# Patient Record
Sex: Male | Born: 1945 | Race: Black or African American | Hispanic: No
Health system: Southern US, Community
[De-identification: ages and names within clinical notes are randomized; demographics above are authoritative.]

---

## 2007-01-23 ENCOUNTER — Emergency Department: Payer: Self-pay | Admitting: Emergency Medicine

## 2007-01-23 ENCOUNTER — Other Ambulatory Visit: Payer: Self-pay

## 2011-01-12 ENCOUNTER — Ambulatory Visit: Payer: Self-pay | Admitting: Internal Medicine

## 2011-02-21 ENCOUNTER — Inpatient Hospital Stay: Payer: Self-pay | Admitting: Internal Medicine

## 2011-02-21 LAB — CBC
HCT: 35.5 % — ABNORMAL LOW (ref 40.0–52.0)
HGB: 11.5 g/dL — ABNORMAL LOW (ref 13.0–18.0)
MCHC: 32.3 g/dL (ref 32.0–36.0)
RBC: 4.19 10*6/uL — ABNORMAL LOW (ref 4.40–5.90)

## 2011-02-21 LAB — COMPREHENSIVE METABOLIC PANEL
Albumin: 3.4 g/dL (ref 3.4–5.0)
Anion Gap: 7 (ref 7–16)
BUN: 9 mg/dL (ref 7–18)
Bilirubin,Total: 0.7 mg/dL (ref 0.2–1.0)
Chloride: 107 mmol/L (ref 98–107)
Creatinine: 1.04 mg/dL (ref 0.60–1.30)
EGFR (African American): >60
EGFR (Non-African Amer.): >60
Glucose: 114 mg/dL — ABNORMAL HIGH (ref 65–99)
Potassium: 3.8 mmol/L (ref 3.5–5.1)
SGPT (ALT): 35 U/L
Sodium: 143 mmol/L (ref 136–145)
Total Protein: 6.8 g/dL (ref 6.4–8.2)

## 2011-02-21 LAB — TROPONIN I: Troponin-I: <0.02 ng/mL

## 2011-02-21 LAB — PRO B NATRIURETIC PEPTIDE: B-Type Natriuretic Peptide: 20435 pg/mL — ABNORMAL HIGH (ref 0–125)

## 2011-02-22 LAB — CBC WITH DIFFERENTIAL/PLATELET
Basophil #: 0 10*3/uL (ref 0.0–0.1)
Eosinophil #: 0.1 10*3/uL (ref 0.0–0.7)
Eosinophil %: 0.9 %
Lymphocyte #: 1.5 10*3/uL (ref 1.0–3.6)
Lymphocyte %: 23.2 %
Monocyte %: 7.4 %
Neutrophil %: 67.9 %
Platelet: 177 10*3/uL (ref 150–440)
RBC: 3.94 10*6/uL — ABNORMAL LOW (ref 4.40–5.90)
RDW: 16.2 % — ABNORMAL HIGH (ref 11.5–14.5)
WBC: 6.4 10*3/uL (ref 3.8–10.6)

## 2011-02-22 LAB — COMPREHENSIVE METABOLIC PANEL
Albumin: 3 g/dL — ABNORMAL LOW (ref 3.4–5.0)
Anion Gap: 10 (ref 7–16)
BUN: 11 mg/dL (ref 7–18)
Bilirubin,Total: 0.6 mg/dL (ref 0.2–1.0)
Chloride: 107 mmol/L (ref 98–107)
Co2: 29 mmol/L (ref 21–32)
Creatinine: 0.92 mg/dL (ref 0.60–1.30)
EGFR (Non-African Amer.): >60
Osmolality: 290 (ref 275–301)
Potassium: 3.6 mmol/L (ref 3.5–5.1)
Sodium: 146 mmol/L — ABNORMAL HIGH (ref 136–145)

## 2011-02-22 LAB — TROPONIN I: Troponin-I: 0.03 ng/mL

## 2011-02-22 LAB — PROTIME-INR
INR: 1.3
INR: 1.3
Prothrombin Time: 16.8 secs — ABNORMAL HIGH (ref 11.5–14.7)
Prothrombin Time: 16.2 secs — ABNORMAL HIGH (ref 11.5–14.7)

## 2011-02-22 LAB — HEMOGLOBIN A1C: Hemoglobin A1C: 7.7 % — ABNORMAL HIGH (ref 4.2–6.3)

## 2011-02-24 LAB — BASIC METABOLIC PANEL
Anion Gap: 8 (ref 7–16)
Calcium, Total: 8.5 mg/dL (ref 8.5–10.1)
Co2: 33 mmol/L — ABNORMAL HIGH (ref 21–32)
EGFR (African American): >60
EGFR (Non-African Amer.): >60
Glucose: 108 mg/dL — ABNORMAL HIGH (ref 65–99)
Osmolality: 288 (ref 275–301)
Potassium: 4.1 mmol/L (ref 3.5–5.1)
Sodium: 144 mmol/L (ref 136–145)

## 2011-02-24 LAB — CBC WITH DIFFERENTIAL/PLATELET
Basophil #: 0 10*3/uL (ref 0.0–0.1)
Basophil %: 0.4 %
Eosinophil #: 0.1 10*3/uL (ref 0.0–0.7)
Lymphocyte #: 1.3 10*3/uL (ref 1.0–3.6)
MCHC: 31.6 g/dL — ABNORMAL LOW (ref 32.0–36.0)
MCV: 84 fL (ref 80–100)
Neutrophil #: 3.7 10*3/uL (ref 1.4–6.5)
RBC: 4.07 10*6/uL — ABNORMAL LOW (ref 4.40–5.90)
RDW: 15.9 % — ABNORMAL HIGH (ref 11.5–14.5)
WBC: 5.6 10*3/uL (ref 3.8–10.6)

## 2011-02-25 LAB — BASIC METABOLIC PANEL
Anion Gap: 8 (ref 7–16)
Calcium, Total: 8.6 mg/dL (ref 8.5–10.1)
Chloride: 102 mmol/L (ref 98–107)
Co2: 31 mmol/L (ref 21–32)
Creatinine: 0.86 mg/dL (ref 0.60–1.30)
Osmolality: 285 (ref 275–301)

## 2011-02-26 LAB — BASIC METABOLIC PANEL
Anion Gap: 9 (ref 7–16)
BUN: 19 mg/dL — ABNORMAL HIGH (ref 7–18)
Co2: 33 mmol/L — ABNORMAL HIGH (ref 21–32)
Creatinine: 0.92 mg/dL (ref 0.60–1.30)
EGFR (Non-African Amer.): >60
Glucose: 116 mg/dL — ABNORMAL HIGH (ref 65–99)
Osmolality: 283 (ref 275–301)
Potassium: 4.3 mmol/L (ref 3.5–5.1)
Sodium: 140 mmol/L (ref 136–145)

## 2011-02-26 LAB — CBC WITH DIFFERENTIAL/PLATELET
Basophil #: 0 10*3/uL (ref 0.0–0.1)
Lymphocyte #: 1.4 10*3/uL (ref 1.0–3.6)
MCH: 27.9 pg (ref 26.0–34.0)
MCHC: 33.1 g/dL (ref 32.0–36.0)
Monocyte #: 0.4 10*3/uL (ref 0.0–0.7)
Monocyte %: 7.2 %
Neutrophil %: 66.5 %
Platelet: 179 10*3/uL (ref 150–440)
RDW: 14.9 % — ABNORMAL HIGH (ref 11.5–14.5)

## 2011-02-27 LAB — BASIC METABOLIC PANEL
Anion Gap: 10 (ref 7–16)
BUN: 18 mg/dL (ref 7–18)
Calcium, Total: 8.5 mg/dL (ref 8.5–10.1)
Chloride: 98 mmol/L (ref 98–107)
Co2: 33 mmol/L — ABNORMAL HIGH (ref 21–32)
EGFR (African American): >60
Glucose: 106 mg/dL — ABNORMAL HIGH (ref 65–99)
Osmolality: 284 (ref 275–301)

## 2011-04-11 ENCOUNTER — Encounter: Payer: Self-pay | Admitting: Nurse Practitioner

## 2011-04-11 ENCOUNTER — Encounter: Payer: Self-pay | Admitting: Cardiothoracic Surgery

## 2011-04-12 ENCOUNTER — Encounter: Payer: Self-pay | Admitting: Cardiothoracic Surgery

## 2011-04-12 ENCOUNTER — Encounter: Payer: Self-pay | Admitting: Nurse Practitioner

## 2011-05-13 ENCOUNTER — Encounter: Payer: Self-pay | Admitting: Nurse Practitioner

## 2011-05-13 ENCOUNTER — Encounter: Payer: Self-pay | Admitting: Cardiothoracic Surgery

## 2011-06-12 ENCOUNTER — Encounter: Payer: Self-pay | Admitting: Nurse Practitioner

## 2011-06-12 ENCOUNTER — Encounter: Payer: Self-pay | Admitting: Cardiothoracic Surgery

## 2012-03-14 ENCOUNTER — Ambulatory Visit: Payer: Self-pay | Admitting: Internal Medicine

## 2012-04-04 LAB — URINALYSIS, COMPLETE
Bilirubin,UR: NEGATIVE
Glucose,UR: NEGATIVE mg/dL (ref 0–75)
Nitrite: NEGATIVE
Ph: 5 (ref 4.5–8.0)
Protein: 30
RBC,UR: 3 /HPF (ref 0–5)
Squamous Epithelial: NONE SEEN
WBC UR: <1 /HPF (ref 0–5)

## 2012-04-04 LAB — COMPREHENSIVE METABOLIC PANEL
Alkaline Phosphatase: 100 U/L (ref 50–136)
Bilirubin,Total: 2.4 mg/dL — ABNORMAL HIGH (ref 0.2–1.0)
Calcium, Total: 9.3 mg/dL (ref 8.5–10.1)
Creatinine: 1.97 mg/dL — ABNORMAL HIGH (ref 0.60–1.30)
EGFR (African American): 40 — ABNORMAL LOW
EGFR (Non-African Amer.): 35 — ABNORMAL LOW
Glucose: 125 mg/dL — ABNORMAL HIGH (ref 65–99)
Osmolality: 298 (ref 275–301)
SGOT(AST): 37 U/L (ref 15–37)
Sodium: 138 mmol/L (ref 136–145)

## 2012-04-04 LAB — TROPONIN I: Troponin-I: 0.02 ng/mL

## 2012-04-04 LAB — CBC
HCT: 51.6 % (ref 40.0–52.0)
MCH: 28.7 pg (ref 26.0–34.0)
MCV: 89 fL (ref 80–100)
RBC: 5.78 10*6/uL (ref 4.40–5.90)
RDW: 16.3 % — ABNORMAL HIGH (ref 11.5–14.5)

## 2012-04-04 LAB — TSH: Thyroid Stimulating Horm: 6.9 u[IU]/mL — ABNORMAL HIGH

## 2012-04-04 LAB — PRO B NATRIURETIC PEPTIDE: B-Type Natriuretic Peptide: 32878 pg/mL — ABNORMAL HIGH (ref 0–125)

## 2012-04-05 ENCOUNTER — Inpatient Hospital Stay: Payer: Self-pay | Admitting: Internal Medicine

## 2012-04-06 LAB — BASIC METABOLIC PANEL
Anion Gap: 11 (ref 7–16)
BUN: 73 mg/dL — ABNORMAL HIGH (ref 7–18)
Chloride: 98 mmol/L (ref 98–107)
Co2: 28 mmol/L (ref 21–32)
EGFR (African American): 40 — ABNORMAL LOW
Glucose: 63 mg/dL — ABNORMAL LOW (ref 65–99)
Osmolality: 293 (ref 275–301)
Potassium: 3.8 mmol/L (ref 3.5–5.1)

## 2012-04-06 LAB — HEMOGLOBIN A1C: Hemoglobin A1C: 7.5 % — ABNORMAL HIGH (ref 4.2–6.3)

## 2012-04-08 LAB — BASIC METABOLIC PANEL
Anion Gap: 13 (ref 7–16)
Creatinine: 3.52 mg/dL — ABNORMAL HIGH (ref 0.60–1.30)
EGFR (African American): 20 — ABNORMAL LOW
Glucose: 79 mg/dL (ref 65–99)

## 2012-04-09 LAB — BASIC METABOLIC PANEL
Anion Gap: 15 (ref 7–16)
BUN: 95 mg/dL — ABNORMAL HIGH (ref 7–18)
Calcium, Total: 8.8 mg/dL (ref 8.5–10.1)
Co2: 23 mmol/L (ref 21–32)
Creatinine: 3.74 mg/dL — ABNORMAL HIGH (ref 0.60–1.30)
EGFR (African American): 19 — ABNORMAL LOW
Osmolality: 285 (ref 275–301)
Potassium: 5.2 mmol/L — ABNORMAL HIGH (ref 3.5–5.1)
Sodium: 128 mmol/L — ABNORMAL LOW (ref 136–145)

## 2012-04-11 ENCOUNTER — Ambulatory Visit: Payer: Self-pay | Admitting: Internal Medicine

## 2012-05-12 DEATH — deceased

## 2014-06-03 NOTE — Consult Note (Signed)
   Comments   I met with pt, wife, daughter, and sister. Pt more awake, oriented x 3 and holding a conversation. I had long discussion concerning medical goals, and d/c planning. PT evaluation remains pending at this time. Questions answered. Discussed with pt about option of STR. He declined STR/SNF, he wants to go home. He was open to home with Hospice services which he would qualify for. He does meet criteria with dx cardiomyopathy. I discussed HCPOA which at this time is his wife even though he does not live with her. I discussed code status and pt verbalizes that he wants to be a DNR. Out of facility form completed and order entered. Emotionalsupport provided.  dx: cardiomyopathy: secondary FTT (bmi 20); CKD  Electronic Signatures for Addendum Section:  Phifer, Izora Gala (MD) (Signed Addendum 25-Feb-14 07:12)  Discussed with Christin Gusler, NP, in detail. Agree with assessment and plan as outlined in above note.   Electronic Signatures: Kateri Mc, Christin Z (NP)  (Signed 24-Feb-14 13:49)  Authored: Palliative Care   Last Updated: 25-Feb-14 07:12 by Phifer, Izora Gala (MD)

## 2014-06-03 NOTE — H&P (Signed)
PATIENT NAME:  Bruce West, Bruce West MR#:  [id number] DATE OF BIRTH:  [birth year]  DATE OF ADMISSION:  04/05/2012  PRIMARY CARE PHYSICIAN:  Dr. Earlene Plater at [facility]   PRIMARY CARDIOLOGIST:  Dr. Rudi Coco   CHIEF COMPLAINT:  Shortness of breath.  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  The patient states at baseline has shortness of breath. Said today, as it is warm, came out, cleaned his yard and cleaned his cars.  After that, started to experience more worsening of his shortness of his breath, and concerning this called EMS.  When the EMS arrived the patient was found to have low oxygen saturations.  Concerning this, patient was brought to the Emergency Department.  Denies having any cough.  Denies having any chest pain, palpitations.  Denies having any worsening of the lower extremity swelling from the baseline.  However, patient states has some weight gain in the last few days.  Has been experiencing severe generalized weakness.  Mostly stays in the bed.  Extremely nauseated with decreased appetite.  The patient states about a year back the patient was weighing 225 pounds; currently weighs 170 pounds.  The patient also states that he has early satiety.  No appetite.  Denies having any diarrhea.  Denies having dysuria.    PAST MEDICAL HISTORY:   1.  Hypertension.  2.  Hyperlipidemia. 3.  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  Hemoglobin A1c one year back was 7.7. 4.  Hypothyroidism. 5.  Dilated cardiomyopathy with EF of 20%.  Heart cath done in 2013 showed nonobstructive coronary artery disease, about 20% to 30% blockage.  6.  Systolic congestive heart failure, chronic.  Workup in the Emergency Department reveals patient has  elevated BUN and creatinine from the baseline.  The patient is also found to have elevated BNP of 32,000.  The patient states he has been compliant with fluid intake.  Usually eats canned food, canned beans.    PAST SURGICAL HISTORY:  None.  ALLERGIES:  No known drug allergies.  HOME MEDICATIONS:   1.  Lantus 42 units twice daily.  2.  Metformin 500 mg twice daily. 3.  Enalapril 10 mg daily. 4.  Pravastatin 40 mg daily. 5.  Neurontin 300 mg 3 times a day. 6.  Lasix 40 mg 2 times a day. 7.  Coreg 3.125 mg 2 times a day. 8.  Citalopram 20 mg daily. 9.  Aspirin 81 mg daily.  SOCIAL HISTORY:  No history of smoking, drinking alcohol or using illicit drugs.  Lives by himself.  Occasionally his grandchildren stay with him.  The patient states independent of ADLs and IADLs.    FAMILY HISTORY:  Diabetes mellitus in the family.    REVIEW OF SYSTEMS:  CONSTITUTIONAL:  Has severe generalized weakness, fatigue, weight loss. EYES:  No change in the vision.  EARS, NOSE, THROAT:  Denies having any tinnitus, hearing loss, difficulty swallowing.  RESPIRATORY:  Has shortness of breath, chronic.  Denies having any PND, orthopnea.  CARDIOVASCULAR:  Denies having any orthopnea, PND.  Has dyspnea on exertion.  GASTROINTESTINAL:  Has chronic nausea, anorexia.  GENITOURINARY:  Denies any dysuria, hematuria. ENDOCRINOLOGY:  Denies any increased polyuria or polydipsia.  HEMATOLOGY:  Denies having any easy bruising, bleeding.  SKIN:  Has chronic ulcers in the lower extremities.   MUSCULOSKELETAL:  Denies having any joint pains in legs.  NEUROLOGIC:  Has numbness and the burning sensation in the lower extremities.   PHYSICAL EXAMINATION: GENERAL:  This is a cachectic-looking male laying down in the bed not in distress. VITAL SIGNS:  Temperature 98.3, pulse 107, blood pressure 104/73, respiratory rate of 16, oxygen saturation is 100% on 2 liters of oxygen. HEENT:  Head normocephalic, atraumatic.  Eyes, no  scleral icterus.  Conjunctivae normal.  Pupils equal and reactive to light.  Bitemporal wasting.   NECK:  Supple.  No lymphadenopathy.  No JVD.  No carotid bruit. CHEST:  Has no focal tenderness.  Bibasilar crackles are heard occasional. HEART:  S1, S2, regular, no murmurs are heard. ABDOMEN:  Was slightly distended. Has fluid shift plus, nontender, nondistended.  EXTREMITIES:  Chronic changes consistent with poor blood flow with some ulcerations on the shins of both legs.   NEUROLOGIC:  The patient is alert, oriented to place, person and time.  Cranial nerves II-XII intact.  No motor and sensory deficits.   LABORATORY DATA:  UA negative for nitrites and leukocyte esterase.  Cardiac enzymes are negative.  TSH is 6.9.  BNP 33,000.  CMP: Sodium 138, BUN 72, creatinine of 1.97.  CBC: WBC of 8.2, hemoglobin 16.6, platelet count of 127.    Chest x-ray, atelectasis and the pleural fluid at the right lung base.  The left lung base clear.  There is mild enlargement of the cardiac silhouette without pulmonary vascular congestion.    ASSESSMENT AND PLAN:  The patient is a 69 year old with dilated cardiomyopathy, diabetes mellitus, comes with shortness of breath.   1.  Shortness of breath secondary to congestive heart failure, acute on chronic.  We will continue with the diuresis.  Continue the Coreg.   2.  Acute renal insufficiency.  The patient had creatinine of 1 in January 2013.  Today it is 1.9.  We will hold the ACE inhibitor.  This could be caused by the congestive heart failure.  We will continue the diuresis and follow closely with BUN and creatinine.  Urine sodium will not give the answer for the cause of the acute renal failure as well as the FeNa as patient is on diuretics.   3.  Diabetes mellitus.  We will hold the metformin.  Continue the Lantus and sliding scale insulin.   4.  Dilated cardiomyopathy.  Left heart cath done in 2013 showed nonobstructive disease 20% to 30%.   5.  Hypertension, on  Coreg.  6.  Failure to thrive.  The patient states has severe anorexia. Has chronic nausea, early satiety.  We will also check the lactic acid considering patient being on metformin and acute renal insufficiency.  The patient states no night sweats, cough.  The patient denies having any risk factors for HIV.  The patient states did not have any colonoscopy in the last 10 years.  Concerning about patient's early satiety we will obtain CT abdomen and pelvis without contrast as the patient has renal insufficiency.  We will check the stool occult.   7.  Protein calorie malnutrition, severe.  We will check the prealbumin.  We will obtain nutritionist consult.   8.  Hypothyroidism.  The patient has a TSH of 6.9.  As per patient's home medications, currently not on any Synthroid.  We will start on 25 mcg.   9.  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  Concern about patient  has severe peripheral vascular disease.  The patient will need workup as an outpatient for peripheral vascular disease obtaining the arterial Dopplers.   10.  Keep the patient on deep vein thrombosis prophylaxis with Lovenox.      ____________________________ Susa GriffinsPadmaja Lucah Petta, MD pv:ea D: 04/05/2012 01:37:42 ET T: 04/05/2012 03:48:20 ET JOB#: [id number]  cc: Susa GriffinsPadmaja Marian Grandt, MD, <Dictator> Susa GriffinsPADMAJA Lenny Fiumara MD ELECTRONICALLY SIGNED 04/06/2012 7:20

## 2014-06-03 NOTE — Discharge Summary (Signed)
PATIENT NAME:  Bruce West, Bruce West MR#:  [id number] DATE OF BIRTH:  03/27/1945  DATE OF ADMISSION:  04/05/2012 DATE OF DISCHARGE:    PRIMARY CARE PHYSIC[facility]  [facility]   PRIMARY CARDIOLOGIST:  Lamar BlinksBruce J. Kowalski, MD  FINAL DIAGNOSES:   1.  Acute on chronic systolic congestive heart failure with cardiomyopathy, ejection fraction less than 20% with anasarca, pleural effusion and hypotension.  2.  Acute renal failure and urinary retention.  3.  Hypoglycemia.  4.  Severe malnutrition.  5.  Weight loss.  6.  Weakness.  7.  Nonhealing ulcers.   MEDICATIONS ON DISCHARGE TO THE HOSPICE HOME:  Include Roxanol 20 mg/mL, 0.5 mL every 1 to 2 hours as needed for pain or shortness of breath.   CODE STATUS:  The patient is a DO NOT RESUSCITATE.   DIET:  As tolerated.   ACTIVITY:  As tolerated.   OXYGEN:  2 liters.   May leave Foley catheter in for urinary retention.   HOSPITAL COURSE:  The patient was admitted on April 05, 2012 and discharged on April 09, 2012. The patient came in with shortness of breath.   HISTORY OF PRESENT ILLNESS:  A 69 year old man with diabetes, cardiomyopathy and CHF. He is coming in with shortness of breath. He does have weight loss. He was admitted to the hospital for acute on chronic systolic congestive heart failure and diuresed with IV Lasix. He had acute renal failure and diabetes. His metformin was held and he was kept on his Lantus initially. For his failure to thrive, a CT scan of the abdomen and pelvis was ordered. He does have severe malnutrition, hypothyroidism and chronic nonhealing ulcers.   LABORATORY AND RADIOLOGICAL DATA DURING THE HOSPITAL COURSE:  Included an EKG that showed left axis deviation and anterior septal infarct previously. Chest x-ray showed atelectasis, pleural fluid at the right lung base, left lung is clear and enlargement of the cardiac silhouette. White blood cell count 8.2, H and H 16.6 and 51.6, platelet count 127. Urinalysis  showed 1+ blood. TSH was 6.9. BNP was 32,878. Glucose was 125, BUN 72, creatinine 1.97, sodium 138, potassium 3.5, chloride 99, CO2 was 27, calcium 9.3, total bilirubin 2.4, alkaline phosphatase 100, ALT 26, AST 37. Ultrasound of the lower extremities showed no evidence of DVT. Prealbumin was 8. CT of abdomen showed large right pleural effusion, consolidated lung, enlargement of the cardiac chambers, large amount of ascites, liver cysts. Occult blood negative. The patient on April 06, 2012 had a sugar of 2.8, hemoglobin A1c was 7.5. Creatinine on April 08, 2012 was up to 3.52 and creatinine on April 09, 2012 was 3.74.   HOSPITAL COURSE PER PROBLEM LIST:  1.  The patient does have end-stage congestive heart failure with cardiomyopathy, EF less than 20% with anasarca, pleural effusions and hypotension. I was holding his medications secondary to hypotension. Initially, the patient was diuresed with Lasix 40 mg IV b.i.d.   2.  The patient developed acute renal failure and urinary retention requiring Foley catheter and IV fluid bolus likely secondary to over-diuresis.  3.  The patient had severe hypoglycemia. His metformin was held on admission. His Lantus needed to be stopped.  4.  For his severe malnutrition, his overall prognosis is poor.  5.  His weight loss is probably secondary to end-stage CHF.  6.  Weakness. The patient is not getting up and ambulatory and did not work well with PT.  7.  For his nonhealing ulcers, they  are probably from arterial and venous insufficiency.   Overall, prognosis is poor. I did speak with sister and hospice liaison. I did speak with the family and patient and convinced him to go to the Hospice Home. The patient will be discharged to the Hospice Home today with Roxanol for pain relief. The patient does have multiorgan failure with hypotension, acute renal failure, end-stage CHF, hypoglycemia, severe malnutrition and respiratory failure on oxygen. Overall, prognosis  is poor. The patient is a DO NOT RESUSCITATE. I do not expect the patient to live much longer.   TIME SPENT ON DISCHARGE:  35 minutes.    ____________________________ Herschell Dimes. Renae Gloss, MD rjw:si D: 04/09/2012 15:03:09 ET T: 04/09/2012 15:18:22 ET JOB#: [id number]  cc: Herschell Dimes. Renae Gloss, MD, <Dictator> Lamar Blinks, MD Dr. Earlene Plater at [facility] MD ELECTRONICALLY SIGNED 04/14/2012 13:15

## 2014-06-03 NOTE — Consult Note (Signed)
PATIENT NAME:  Bruce West, Bruce West MR#:  [id number] DATE OF BIRTH:  03/21/1945  DATE OF CONSULTATION:  04/08/2012  REFERRING PHYSICIAN:  Dr. Jacques Navy. CONSULTING PHYSICIAN:  Wess Baney C. Ikaika Showers, MD  REASON FOR CONSULTATION:  Acute urinary retention.   HISTORY OF PRESENT ILLNESS:  The patient is a 69 year old male admitted 04/05/2012 with shortness of breath secondary to congestive heart failure.  He was also noted to have acute renal insufficiency with creatinine of 1.9.  He has a dilated cardiomyopathy.  He denies any previous history of voiding problems or urologic problems.  He is noted to have decreased urine output.  In and out catheterization was performed this morning for a bladder scan and approximately 220 mL with only 150 mL of urine obtained.  This evening his bladder scan was 700 mL.  He had multiple attempts at a standard 16 [place] Foley, 14 and 16 French coude catheters and a regular 14 French catheter without success.  Urology consultation was requested.  He presently has no urge to void.  No previous history of urologic problems or prostate surgery.   PAST MEDICAL HISTORY: 1.  Hypertension.  2.  Hyperlipidemia.  3.  Long-standing diabetes mellitus.  4.  Hypothyroidism.  5.  Dilated cardiomyopathy.  6.  Congestive heart failure.   PAST SURGICAL HISTORY:  None.   MEDICATIONS ON ADMISSION:  Lantus insulin 42 units twice daily, metformin 500 mg twice daily, enalapril 10 mg daily, pravastatin 40 mg daily, Neurontin 300 mg 3 times a day, Lasix 40 mg twice a day, Coreg 3.125 mg twice daily, citalopram 20 mg daily, ASA 81 mg daily.   SOCIAL HISTORY:  No tobacco or alcohol use.   REVIEW OF SYSTEMS:  CONSTITUTIONAL:  Positive weakness, fatigue, weight loss.  EYES:  No visual changes.  EARS, NOSE, THROAT:  No hearing loss, nasal congestion.  PULMONARY:  Positive shortness of breath.  CARDIOVASCULAR:  No chest pain or palpitations.  Positive dyspnea on exertion.  GASTROINTESTINAL:  Chronic  nausea.  GENITOURINARY:  As per the history of present illness.  ENDOCRINE:  Long-standing diabetes mellitus.  HEMATOLOGY:  No history of bleeding or clotting disorders.  SKIN:  Chronic lower extremity ulcers.  MUSCULOSKELETAL:  No joint pain.  NEUROLOGIC:  Lower extremity paresthesias.   PHYSICAL EXAMINATION:  VITAL SIGNS:  Temperature 97.6, blood pressure 119/91, pulse 92.  GENERAL:  Thin male in no acute distress.   ABDOMEN:  Protuberant.  The bladder is not palpable or percussible.  GENITOURINARY:  Phallus uncircumcised without lesions.  Testes descended bilaterally.  Prostate examination was deferred.   LABORATORY DATA:  CT was reviewed, bladder was no significant distention.  No significant prostate enlargement noted.   PROCEDURE:  External genitalia were prepped and draped in the usual fashion.  A 12 French silicon catheter was passed and did feel it was curling within the prostate.  No significant resistance was noted.  This was removed and an 53 [place] coude catheter was placed without problems.  Approximately 150 mL of slightly bloody urine was obtained.  Catheter irrigated without problems confirming correct position.   IMPRESSION:  Status post Foley catheter placement.  He does not appear to have significant urinary retention.  His bladder scan may be false-positive secondary to significant ascites.   RECOMMENDATION:  Would keep catheter in place until no longer needed for output monitoring.        ____________________________ Verna Czech. Lonna Cobb, MD scs:ea D: 04/08/2012 23:57:00 ET T: 04/09/2012 01:53:11 ET JOB#: [id number]  cc:  Jeni Duling C. Lonna CobbStoioff, MD, <Dictator> Riki AltesSCOTT C Susann Lawhorne MD ELECTRONICALLY SIGNED 04/23/2012 22:51

## 2014-06-03 NOTE — Consult Note (Signed)
Brief Consult Note: Patient was seen by consultant.   Consult note dictated.   Comments: 18 Fr coude cath placed w/o problems.  Electronic Signatures: Riki AltesStoioff, Dmiyah Liscano C (MD)  (Signed [phone]-Feb-14 23:58)  Authored: Brief Consult Note   Last Updated: 26-Feb-14 23:58 by Riki AltesStoioff, Devlin Mcveigh C (MD)

## 2014-06-03 NOTE — Consult Note (Signed)
CHIEF COMPLAINT and HISTORY:  Subjective/Chief Complaint BLE ulcerations   History of Present Illness Patient admited with CHF, failure to thrive.   Had been found to have ulcers on both LE.  Unclear how long they have been there.  He is very somnulent today and does not provide any history to me.  This is obtained from the medical record.   Past History CHF, renal insufficiency   Past Medical Health Coronary Artery Disease, Hypertension, Diabetes Mellitus   PAST MEDICAL/SURGICAL HISTORY:  Past Medical History:   Diabetes:    Hypercholesterolemia:    htn:   ALLERGIES:  Allergies:  No Known Allergies:   HOME MEDICATIONS:  Home Medications: Medication Instructions Status  furosemide 40 mg oral tablet 1 tab(s) orally 2 times a day Active  Silvadene 1% topical cream application topically once a day Active  BD ultrafine short pen needles    once a day Active  Lantus Solostar Pen 42 unit(s) subcutaneous 2 times a day Active  Neurontin 300 mg oral capsule 1 cap(s) orally 3 times a day Active  lancets  1   3 times a day Active  aspirin 81 mg oral tablet 1 tab(s) orally once a day Active  Lasix 40 mg oral tablet 1 tab(s) orally 2 times a day Active  metformin 500 mg oral tablet 1 tab(s) orally 2 times a day Active  potassium chloride 20 mEq oral tablet, extended release 1 tab(s) orally once a day Active  Coreg 3.125 mg oral tablet 1 tab(s) orally 2 times a day Active   Family and Social History:  Family History Non-Contributory   Social History negative tobacco, negative ETOH, negative Illicit drugs, from the H&P, patient does not provide to me   Place of Living Home   Review of Systems:  ROS Pt not able to provide ROS   Medications/Allergies Reviewed Medications/Allergies reviewed   Physical Exam:  GEN chronically ill appearing   HEENT moist oral mucosa, poor dentition   NECK No masses  trachea midline   RESP normal resp effort  no use of accessory muscles    CARD irregular rate  murmur present   ABD denies tenderness  distended  appears to have ascites   LYMPH negative neck, negative axillae   EXTR positive edema   SKIN positive ulcers, BLE ulcers with stasis changes present.  Pulses not really palpable, but swelling and woody tissue present.   NEURO difficult to assess with somnulence   PSYCH poor insight, lethargic   LABS:  Laboratory Results: Thyroid:    22-Feb-14 22:25, Thyroid Stimulating Hormone  Thyroid Stimulating Hormone 6.90  0.45-4.50  (International Unit)   -----------------------  Pregnant patients have   different reference   ranges for TSH:   - - - - - - - - - -   Pregnant, first trimetser:   0.36 - 2.50 uIU/mL  Hepatic:    22-Feb-14 22:25, Comprehensive Metabolic Panel  Bilirubin, Total 2.4  Alkaline Phosphatase 100  SGPT (ALT) 26  SGOT (AST) 37  Total Protein, Serum 8.2  Albumin, Serum 3.6  General Ref:    23-Feb-14 03:29, Prealbumin  Prealbumin   ========== TEST NAME ==========  ========= RESULTS =========  = REFERENCE RANGE =    PREALBUMIN    Prealbumin  Prealbumin                      [L  8 mg/dL              ]  [place]            No: [id number]            [address] [place], [place] [address]            Lindon Romp, MD         [phone]     Result(s) reported on 06 Apr 2012 at 02:18AM.  Routine Chem:    22-Feb-14 09:32, Basic Metabolic Panel (w/Total Calcium)  Glucose, Serum -  BUN -  Creatinine (comp) -  Sodium, Serum -  Potassium, Serum -  Chloride, Serum -  CO2, Serum -  Calcium (Total), Serum -  Anion Gap -  Osmolality (calc) -  eGFR (African American) -  eGFR (Non-African American) -  eGFR values <69m/min/1.73 m2 may be an indication of chronic  kidney disease (CKD).  Calculated eGFR is useful in patients with stable renal function.  The eGFR calculation will not be reliable in acutely ill patients  when serum  creatinine is changing rapidly. It is not useful in   patients on dialysis. The eGFR calculation may not be applicable  to patients at the low and high extremes of body sizes, pregnant  women, and vegetarians.    22-Feb-14 21:10, Troponin I  Result Comment   METB/CARDIAC/TROP - CANCEL/SPECIMEN HEMOLYZED/NEEDS REDRAW   Result(s) reported on 04 Apr 2012 at 09:49PM.    22-Feb-14 22:25, B-Type Natriuretic Peptide ([facility]  B-Type Natriuretic Peptide ([facility] [phone] Result(s) reported on 04 Apr 2012 at 11:14PM.    22-Feb-14 22:25, Cardiac Panel  Result Comment   POTASSIUM/CPK/AST - Slight hemolysis, interpret results with   - caution.   Result(s) reported on 04 Apr 2012 at 11:02PM.    22-Feb-14 22:25, Comprehensive Metabolic Panel  Glucose, Serum 125  BUN 72  Creatinine (comp) 1.97  Sodium, Serum 138  Potassium, Serum 3.5  Chloride, Serum 99  CO2, Serum 27  Calcium (Total), Serum 9.3  Osmolality (calc) 298  eGFR (African American) 40  eGFR (Non-African American) 35  eGFR values <635mmin/1.73 m2 may be an indication of chronic  kidney disease (CKD).  Calculated eGFR is useful in patients with stable renal function.  The eGFR calculation will not be reliable in acutely ill patients  when serum creatinine is changing rapidly. It is not useful in   patients on dialysis. The eGFR calculation may not be applicable  to patients at the low and high extremes of body sizes, pregnant  women, and vegetarians.  Anion Gap 12    24-Feb-14 0458:09Basic Metabolic Panel (w/Total Calcium)  Glucose, Serum 63  BUN 73  Creatinine (comp) 1.99  Sodium, Serum 137  Potassium, Serum 3.8  Chloride, Serum 98  CO2, Serum 28  Calcium (Total), Serum 8.9  Anion Gap 11  Osmolality (calc) 293  eGFR (African American) 40  eGFR (Non-African American) 34  eGFR values <6054min/1.73 m2 may be an indication of chronic  kidney disease (CKD).  Calculated eGFR is useful in patients with stable renal function.  The  eGFR calculation will not be reliable in acutely ill patients  when serum creatinine is changing rapidly. It is not useful in   patients on dialysis. The eGFR calculation may not be applicable  to patients at the low and high extremes of body sizes, pregnant  women, and vegetarians.    24-Feb-14 04:37, Hemoglobin A1c ([facility])  Hemoglobin  A1c [facility]) 7.5  The American Diabetes Association recommends that a primary goal of  therapy should be <7% and that physicians should reevaluate the  treatment regimen in patients with HbA1c values consistently >8%.  Cardiac:    22-Feb-14 21:10, Cardiac Panel  CK, Total -  CPK-MB, Serum -  Result(s) reported on 04 Apr 2012 at 09:49PM.    22-Feb-14 21:10, Troponin I  Troponin I -  0.00-0.05  0.05 ng/mL or less: NEGATIVE   Repeat testing in 3-6 hrs   if clinically indicated.  >0.05 ng/mL: POTENTIAL   MYOCARDIAL INJURY. Repeat   testing in 3-6 hrs if   clinically indicated.  NOTE: An increase or decrease   of 30% or more on serial   testing suggests a   clinically important change    22-Feb-14 22:25, Cardiac Panel  CK, Total 77  CPK-MB, Serum 3.1    22-Feb-14 22:25, Troponin I  Troponin I 0.02  0.00-0.05  0.05 ng/mL or less: NEGATIVE   Repeat testing in 3-6 hrs   if clinically indicated.  >0.05 ng/mL: POTENTIAL   MYOCARDIAL INJURY. Repeat   testing in 3-6 hrs if   clinically indicated.  NOTE: An increase or decrease   of 30% or more on serial   testing suggests a   clinically important change  Routine UA:    22-Feb-14 22:25, Urinalysis  Color (UA) Yellow  Clarity (UA) Clear  Glucose (UA) Negative  Bilirubin (UA) Negative  Ketones (UA) Negative  Specific Gravity (UA) 1.011  Blood (UA) 1+  pH (UA) 5.0  Protein (UA) 30 mg/dL  Nitrite (UA) Negative  Leukocyte Esterase (UA) Negative  Result(s) reported on 04 Apr 2012 at 11:18PM.  RBC (UA) 3 /HPF  WBC (UA) <1 /HPF  Bacteria (UA) TRACE  Epithelial Cells (UA)   NONE SEEN  Mucous  (UA) PRESENT  Hyaline Cast (UA) 14 /LPF  Result(s) reported on 04 Apr 2012 at 11:18PM.  Routine Sero:    23-Feb-14 11:20, Occult Blood, Feces  Occult Blood, Feces NEGATIVE  Result(s) reported on 05 Apr 2012 at 12:14PM.  Routine Hem:    22-Feb-14 21:10, Hemogram, Platelet Count  WBC (CBC) 8.2  RBC (CBC) 5.78  Hemoglobin (CBC) 16.6  Hematocrit (CBC) 51.6  Platelet Count (CBC) 127  Result(s) reported on 04 Apr 2012 at 09:32PM.  MCV 89  MCH 28.7  MCHC 32.2  RDW 16.3   RADIOLOGY:  Radiology Results: XRay:    22-Feb-14 19:34, Chest PA and Lateral  Chest PA and Lateral  REASON FOR EXAM:    SOB  COMMENTS:       PROCEDURE: DXR - DXR CHEST PA (OR AP) AND LATERAL  - Apr 04 2012  7:34PM     RESULT: Comparison is made to the study of February 21, 2011.    The lungs are better inflated today. There remain increased lung markings   at the right lung base with a small amount of pleural fluid noted. The   left lung base is clear and the costophrenic angle is sharp. The cardiac   silhouette is mildly enlarged. The pulmonary vascularity is not clearly   engorged. The mediastinum is normal in width. The bony thorax exhibits no   acute abnormality.    IMPRESSION:   1. There is atelectasis and pleural fluid at the right lung base. The   left lung is clear.  2. There is mild enlargement of the cardiac silhouette without pulmonary   vascular congestion.  Dictation Site: 5        Verified By: DAVID A. Martinique, M.D., MD  [place]:    23-Feb-14 00:33, [place] Color Flow Doppler Low Extrem Bilat (Legs)  [place] Color Flow Doppler Low Extrem Bilat (Legs)  REASON FOR EXAM:    decreased activity, leg pain  COMMENTS:       PROCEDURE: [place]  - US DOPPLER LOW EXTR BILATERAL  - Apr 05 2012 12:33AM     RESULT: Grayscale and color flow Doppler techniques were employed to   evaluate the deep veins of the right and left lower extremities.    The common femoral, superficial femoral, and popliteal veins are  normally   compressible. The waveform patterns are normal and the color flow images   are normal. The patient does appear to be tachycardic. The response to   the augmentation and Valsalva maneuvers is normal.    IMPRESSION:  There is no evidence of thrombus within the deep veins of   the right or left lower extremities.  A preliminary report was sent to the emergency department at the   conclusion of thestudy.     Dictation Site: 1        Verified By: DAVID A. Martinique, M.D., MD  LabUnknown:    13-Mar-13 11:39, Wound Aerobic/Anaerobic Culture  Ind. Clindamycin Resistance    22-Feb-14 19:34, Chest PA and Lateral  PACS Image    23-Feb-14 00:33, [place] Color Flow Doppler Low Extrem Bilat (Legs)  PACS Image    23-Feb-14 08:51, CT Chest Abdomen and Pelvis WO  PACS Image  CT:  CT Chest Abdomen and Pelvis WO  REASON FOR EXAM:    (1) weight loss; (2) Early satiety  COMMENTS:   May transport without cardiac monitor    PROCEDURE: CT  - CT CHEST ABDOMEN AND PELVIS WO  - Apr 05 2012  8:51AM     RESULT: Axial noncontrast CT scanning was performed through the chest,   abdomen, and pelvis. The patient's serum creatinine is 1.9 mg/dL which   precluds use of IV contrast. Review of multiplanar reconstructed images   was performed separately on the VIA monitor.    CT scan of the chest: There is a large right pleural effusion. There is   consolidated lung adjacent to the anterior surface of this effusion in   the lower hemithorax. There is no left pleural effusion. The cardiac   chambers are enlarged. The caliber of the thoracic aorta is normal. No   bulky mediastinal or hilar lymph nodes are demonstrated. There is no     axillary lymphadenopathy. There is generalized wasting of subcutaneous   fat.    At lung window settings there is minimal increased interstitial density   in the posterior costophrenic gutter on the left. There is the   aforementioned consolidated lung along the anterior  surface of the right   pleural effusion. There are atelectatic versus fibrotic changes in the   right middle lobe. No suspicious pulmonary parenchymal masses are   demonstrated. The thoracic vertebral bodies are preserved in height. No   lytic nor blastic rib lesion is demonstrated.    Conclusion:  1. There is a large right pleural effusion. There is consolidated lung   along the anterior surface of the pleural effusion in the right lower   hemithorax.  2. There is enlargement of the cardiac chambers without definite evidence   of a pericardial effusion.  3. No pulmonary parenchymal mass is demonstrated.  CT scan of the abdomen: There are four hypodensities within the liver.   The largest lies in the left lobe and measures 2.3 centimeters in   greatest dimension. It exhibits Hounsfield measurement of +2 and is most   compatible with a cyst. There is no intrahepatic ductal dilation. There   is a large amount of ascites within the abdomen. No discrete omental   masses are demonstrated but fine detail of the soft tissue structures   within the abdomen and pelvis is limited due to lack of oral and IV   contrast and to the paucity of intra-abdominal fat.    The stomach is partially distended with fluid and fluid and gas. The   pancreas exhibits fatty infiltration but no focal mass. The gallbladder     is adequately distended. I cannot exclude subcentimeter radiodense stones   or sludge in the dependent portion of the gallbladder. No adrenal masses   are demonstrated. There is no evidence of a small or large bowel   obstruction. Loops of normal calibered gas-filled small and large bowel   are floating in the ascites. The spleen is not enlarged. There is   increased density within the retroperitoneal fat fairly diffusely. The   kidneys exhibit normal contour and no evidence of calcified stones or   obstruction. The caliber of the abdominal aorta is normal. No bulky   periaortic or  pericaval or mesenteric lymph nodes are demonstrated.    Within the inguinal regions no hernia is demonstrated. There are a few   borderline enlarged lymph nodes present in the inguinal regions. The   lumbar vertebral bodies are preserved in height. The bony pelvisexhibits   no lytic or blastic lesions.  IMPRESSION:   1. There is a large right pleural effusion and consolidated right lung   adjacent to this effusion. No discrete pulmonary mass is demonstrated but   certainly one could be obscured by the fluid and consolidated lung.  2. There is a large amount of ascites present. There are hypodensities in   the liver most compatible with cysts. The liver does not appear to be   shrunken and there is no splenomegaly. However, cirrhosis is not excluded.  3. There is no evidence of bowel obstruction or ileus. No suspicious   bowel wall thickening or luminal masses are demonstrated.  4. There is no acute urinary tract abnormality.  5. No pancreatic mass is demonstrated.    The sensitivity of the study is limited without oral and intravenous   contrast material.   Dictation Site: 5        Verified By: DAVID A. Martinique, M.D., MD   ASSESSMENT AND PLAN:  Assessment/Admission Diagnosis BLE ulceration. Likely mixed arterial and venous and poor perfusion from low EF   Plan will likely need bilateral LE angiograms in staged fashion with renoprotective agents if aggressive care is desired.  See palliative care to get involved.  Would also need outpatient work up for venous disease.  Multiple other issues and high risk of limb loss present.  Will check back tomorrow/another day to see if he is more alert an cooperative.  Difficult to arouse today   Electronic Signatures: Algernon Huxley (MD)  (Signed 24-Feb-14 11:19)  Authored: Chief Complaint and History, PAST MEDICAL/SURGICAL HISTORY, ALLERGIES, HOME MEDICATIONS, Family and Social History, Review of Systems, Physical Exam, LABS, RADIOLOGY,  Assessment and Plan   Last Updated: 24-Feb-14 11:19 by Algernon Huxley (MD)

## 2014-06-03 NOTE — Consult Note (Signed)
PATIENT NAME:  Bruce West, [name]:  [id number] DATE OF BIRTH:  02-Dec-1945  DATE OF CONSULTATION:  04/05/2012  REFERRING PHYSICIAN[facility]  CONSULTING PHYSICIAN:  Shreyansh Tiffany D. Juliann Paresallwood, MD  He also sees Rudi Cocoonna Carroll, MD and Arnoldo HookerBruce Kowalski, [facility].   INDICATION: Shortness of breath and congestive heart failure.   HISTORY OF PRESENT ILLNESS: The patient is a 69 year old African American male with a history of diabetes, dilated cardiomyopathy with ejection fraction about 20% and systolic heart failure, who presented via EMS with complaint of shortness of breath. He states he has baseline shortness of breath, but it got suddenly worse. He was clearing a yard. He started having worsening shortness of breath soon after. It got bad enough with his dyspnea that he called EMS. He did not have any cough or chest pain prior to the episode. No worsening leg edema. He complained of generalized weakness. He had some nausea and decreased appetite. He has lost weight over the last year or so and is not sure why because he has had a depressed appetite for unclear reasons,  so with his worsening symptoms he finally decided to come to the Emergency Room for evaluation.   REVIEW OF SYSTEMS: No blackout spells or syncope. He had some vague nausea. No vomiting. No fever, no chills, no sweats. He had some weight loss, but no weight gain. No hemoptysis or hematemesis. Denies bright red blood per rectum.   PAST MEDICAL HISTORY: Hypertension, hyperlipidemia, diabetes, hypothyroidism, dilated cardiomyopathy and systolic heart failure.   PAST SURGICAL HISTORY: None.   ALLERGIES: None.   FAMILY HISTORY: Diabetes.   SOCIAL HISTORY: No history of smoking. Quit drinking. Lives alone.   MEDICATIONS: Lantus 42 units twice a day, metformin 500 mg twice a day, enalapril 10 mg a day, pravastatin 40 mg a day, Neurontin 300 mg 3 times a day, Lasix 40 mg 2 times a day, Coreg 3.125 mg 2 times a day, Citalopram 20 mg  a day and aspirin 81 mg a day.   PHYSICAL EXAMINATION:   VITAL SIGNS: Blood pressure was 105/75, pulse 105, respiratory rate 16, afebrile.  HEENT: Normocephalic, atraumatic. Pupils equal and reactive to light.  NECK: Supple. Mild JVD. No bruits or adenopathy.  LUNGS: Clear to auscultation and percussion with bibasilar crackles mild rhonchi.  HEART: Regular rate and rhythm. Positive S3. Soft systolic ejection murmur at the apex.  ABDOMEN: Benign. Positive bowel sounds. No rebound, guarding, or tenderness.  EXTREMITIES: Basically within normal limits except for mild ulceration of both legs. Borderline edema.  NEUROLOGIC: Grossly intact.  SKIN: Normal.  LABORATORIES: UA was negative. Cardiac enzymes negative. TSH 6.9. BNP 33,000. Sodium 138, BUN 72, creatinine 1.97. White count 8.2, hemoglobin 16.6 and platelet count 127.   CHEST X-RAY: Basically unremarkable except for enlarged heart and mild pulmonary vascular congestion.   ASSESSMENT:  1.  Shortness of breath.  2,  Mild heart failure. 3.  Renal insufficiency.  4.  Diabetes. 5.  Dilated cardiomyopathy. 6.  History of hypertension.  7.  Hypothyroidism.  8.  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  9.  Weight loss with failure to thrive.   PLAN: I agree with admission to address his heart failure issues. Hopefully increase diuresis. Place on telemetry. Supplemental oxygen as necessary. Continue to follow cardiac enzymes. Consider repeat echocardiogram. Continue current medical therapy for heart failure. I would recommend continue Coreg for hypertension. I would continue a low dose ACE inhibitor for heart failure. Diabetes management  as we are doing. Followup hemoglobin A1c. I do not recommend invasive evaluation at this point. We will consult dietary for his overall weight loss. Would consider whether GI evaluation will be helpful. Further evaluation after results of further studies.     ____________________________ Bobbie Stack Juliann Pares,  MD ddc:aw D: 04/06/2012 07:43:46 ET T: 04/06/2012 08:25:38 ET JOB#: [id number]  cc: Kyonna Frier D. Juliann Pares, MD, <Dictator> Alwyn Pea MD ELECTRONICALLY SIGNED 05/11/2012 9:18

## 2014-06-05 NOTE — H&P (Signed)
PATIENT NAME:  Bruce West, Bruce West MR#:  [id number] DATE OF BIRTH:  [birth year]  DATE OF ADMISSION:  02/21/2011  PRIMARY CARE PHYSICIAN: Dr. Earlene Plater at [facility].   CHIEF COMPLAINT: Shortness of breath on walking around. I am swollen all over.   HISTORY OF PRESENT ILLNESS: The patient is a 69 year old male with a history of diabetes for the last 30 years, not controlled. He says his HbA1c is probably more than 12.0. He also has a history of hyperlipidemia. He is complaining of increased swelling going on for about 3 to 4 weeks right now. Initially, he noted that his thighs are getting tighter and swollen, and then it has been extremely progressive since then. He has swelling of his bilateral lower extremities. He says that his swelling of his abdomen, his abdomen is getting tighter and tighter. He has gained waist size. He was 38 before, and now he is 42 waist size. He also has gained weight because of his swelling. He is complaining of shortness of breath on exertion, but he denies any orthopnea or paroxysmal nocturnal dyspnea. He denies any chest pain. He has no previous history of congestive heart failure. He has no previous cardiac history of myocardial infarction in the past. He says he had a stress test a few years ago, and that was normal as per the patient. He says his doctor prescribed him furosemide 20 mg twice a day. He was supposed to take 1 pill twice a day, but that was not helping him; so he was taking two pills twice a day, and still that was not helping him he says. He denies any nausea or vomiting. His initial work-up in the Emergency Room showed that he has a BNP of 20,435. His troponin is negative. His LFTs are essentially normal with a normal albumin of 3.4, a normal creatinine of 1.04. His chest x-ray shows that he has bibasilar pleural effusions which obscure the lower 20% of lung fields. He presented to [facility] on 01/12/2011, and his chest x-ray actually at that time showed opacities in  the lower lobes bilaterally and bilateral pleural effusions. He already took a course of antibiotics for that.   REVIEW OF SYSTEMS: CONSTITUTIONAL: Positive for weakness, weight gain. No fever. HEENT: No acute change in vision. No headache. No dizziness. RESPIRATORY: No cough. He is complaining of dyspnea on exertion. No orthopnea. CARDIOVASCULAR: No chest pain, no palpitations or syncope. GASTROINTESTINAL: No nausea, vomiting, abdominal pain, but he is complaining of abdominal distention. He denies any GI bleed. GENITOURINARY: No dysuria. No frequency. ENDOCRINE: No thyroid problems. HEMATOLOGIC: No anemia. MUSCULOSKELETAL: He is complaining of increasing leg swelling and is complaining of swelling of his groin in the testicle area. 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 PSYCHIATRIC: No anxiety or depression.   PAST MEDICAL HISTORY:  1. Diabetes, uncontrolled. He has had diabetes for the last 30 years. He says his hemoglobin A1c was in the range of 12.0. 2. Hyperlipidemia.  3. No history of heart disease or hypertension.   PAST SURGICAL HISTORY: None.   ALLERGIES TO MEDICATIONS: None.   HOME MEDICATIONS:   1. Enalapril daily, he takes about 10 mg.  2. Metformin twice a day, 500 mg.  3. Novolin 70/30, 40 units twice a day.  4. Pravastatin 40 mg at bedtime.   SOCIAL HISTORY: He lives with his wife in [place].  He denies any smoking, denies any long-term alcohol use. Occasional beer drinker. He denies any drug use.   FAMILY  HISTORY: Diabetes in the family.   PHYSICAL EXAMINATION:  VITAL SIGNS: When he presented to the Emergency Room, temperature was 97.4, heart rate 84, respiratory 18, blood pressure 163/95, saturating 95% on room air.   GENERAL: This is a middle-aged [place] American male who appears to be in anasarca. He has significant abdominal distention and lower extremity edema.   HEENT: Bilateral pupils are equal. Extraocular muscles are intact. No scleral icterus.  No conjunctivitis. Oral mucosa is moist. No pallor.   NECK: No thyroid tenderness, enlargement or nodule. Neck is supple. No masses, nontender. No adenopathy. No JVD. No carotid bruit.   CHEST: Decreased breath sounds in the lower lung fields bilaterally but no crackles or rhonchi. Normal respiratory effort. Not using accessory muscles of respiration.  HEART: Heart sounds are regular. No murmur. He has bilateral lower extremity edema. He has tense edema going up almost to the thighs.    ABDOMEN: Abdomen is distended, but it is soft, nontender. He has hepatosplenomegaly. I could feel both his liver and spleen. Normal bowel sounds. No masses appreciable. No bruit.   RECTAL: Exam is deferred.   NEUROLOGIC: He is awake, alert, oriented to time, place, and person. Cranial nerves are intact. Moving all extremities against gravity.   EXTREMITIES: No cyanosis. No clubbing.   SKIN: Skin has extensive edema. He has edema of his abdominal wall. He has abdominal distention. He has edema of his groin area, testicle area, bilateral thighs. He has some healed wounds on his lower extremities.   LABORATORY, DIAGNOSTIC AND RADIOLOGICAL DATA:  His laboratory work shows that he has a white count of 7.4, hemoglobin of 11.5, platelet count of 206,000, normocytic.  BMP: Sodium 143, potassium 3.8, BUN 9, creatinine 1.04.  His albumin is 3.4. His alkaline phosphatase is slightly elevated at 142, but ALT and AST are normal.  His BNP is 20,437. Troponin is negative.  His EKG shows that he has low-grade sinus rhythm. He has some Q waves in the anterior leads with nonspecific T wave changes. No prior EKG to compare with.   IMPRESSION: He has anasarca. He has abdominal wall edema, lower extremity edema, appears to have ascites also, dyspnea on exertion, bilateral pleural effusion. Differential diagnosis will include liver disease. He could have cardiomyopathy or congestive heart failure, bilateral pleural effusions,  suspect congestive heart failure, hepatosplenomegaly, diabetes, uncontrolled hyperlipidemia.   PLAN: The patient is a 69 year old male who has history of diabetes for the last 30 years, uncontrolled, also history of hyperlipidemia. He said that he has had increasing swelling going on for about 3 to 4 weeks. He has tense swelling of his bilateral lower extremities almost up to the thigh. He has swelling of his groin in the testicle area, abdominal wall swelling; and he has some ascites, also he has bilateral pleural effusion, hepatosplenomegaly. His albumin, though, is normal at 3.4. This is concerning for severe cardiomyopathy or concerning for liver disease. I am going to check an echocardiogram on him to start with, get a PT and INR on him to assess liver status. I will get an ultrasound of the abdomen on him because of his hepatosplenomegaly and to rule out any ascites. He needs daily weight monitoring also. We will get a Cardiology consult on him. He says the oral Lasix has not helped his anasarca, so I will put him on IV Lasix. He already got 60 mg of IV Lasix in the Emergency Room. I am going to start him on IV Lasix 40 mg  every 12 hours with potassium, continue his enalapril. We will continue his insulin 70/30 for his diabetes. We will hold his metformin at this time in case he needs any contrast study. We will continue his aspirin. I am also going to hold his statin at this time if this is liver disease.   The plan was discussed with the patient and wife in detail.   TIME SPENT:   Time spent with admission and coordination of care was 55 minutes.   ____________________________ Fredia SorrowAbhinav Espiridion Supinski, MD ag:cbb D: 02/21/2011 16:56:03 ET T: 02/21/2011 18:08:51 ET JOB#: [id number]  cc: Fredia SorrowAbhinav Jaclyn Andy, MD, <Dictator> [facility]  Ximena Todaro MD ELECTRONICALLY SIGNED 03/08/2011 10:56

## 2014-06-05 NOTE — Discharge Summary (Signed)
PATIENT NAME:  DJON, TITH MR#:  [id number] DATE OF BIRTH:  09/26/1945  DATE OF ADMISSION:  02/21/2011 DATE OF DISCHARGE:  02/27/2011  ADMITTING PHYSICIAN: Fredia Sorrow, MD  DISCHARGING PHYSICIAN: Larena Glassman, MD  PRIMARY CARE PHYSICIAN: Dr. Earlene Plater [facility]   REASON FOR ADMISSION: Shortness of breath, anasarca.   DISCHARGE DIAGNOSES:  1. Lower extremity edema and abdominal distention secondary to systolic congestive heart failure, ejection fraction 20%.  2. Diabetes.  3. Hypertension.  4. Hypernatremia. 5. Anemia.  6. Low albumin. 7. Liver cyst.   CONSULTANTS:  1. Arnoldo Hooker, MD. 2. Case Management. 3. Physical Therapy. 4. Occupational Therapy.   LABS/STUDIES: Chest x-ray on 02/21/2011 showed bibasilar pleural effusion which obscure approximately the lower 20% of the lungs.  Ultrasound of the abdomen on 02/22/2011 showed there is moderate ascites. Bilateral pleural effusions are seen. There is an apparent pericardial effusion. Cholelithiasis. There is thickening of the gallbladder wall.   Echo Doppler done by Dr. Arnoldo Hooker showed severely reduced LVF with estimated ejection fraction 20%, left ventricular hypertrophy, LVE, PAE, moderate TR, mild to moderate MR, mild AI.   Cardiac catheterization on 02/26/2011 showed acute severe congestive heart failure. Severe dilatation of LV. Severe global LV dysfunction. Minimal three-vessel coronary artery disease. In the proximal LAD there was 20% stenosis. In the mid LAD there was 10% stenosis. In the proximal circumflex there was 10% stenosis. In the mid circumflex there was 30% stenosis. In the proximal ramus intermedius there was 20% stenosis. In the proximal RCA there was 40% stenosis.   HOSPITAL COURSE: Initial history and physical were done by Dr. Fredia Sorrow. Please refer to his note dictated on 02/21/2011 for complete details. In brief this is a 69 year old male with past medical history of diabetes and hyperlipidemia who  presents with anasarca. There was concern that he may have had liver disease causing ascites. His LFTs were within normal limits and his BNP was found to be elevated at 20,000. He had pleural effusions. He was admitted to the hospitalist service.  1. Anasarca: Echo showed ejection fraction of 20%. This was due to systolic congestive heart failure. Abdominal ultrasound showed no abnormality, except for liver cysts. He had normal LFTs. He was diuresed with IV Lasix and improved significantly. He had the above-noted cardiac catheterization to make sure there was no ischemic causes of his cardiomyopathy. In addition to his Lasix, he was put on supplemental potassium. 2. Diabetes: We held his metformin and put him on sliding scale insulin. Hemoglobin A1c was found to 7.7. He was recommended to go back on metformin. He did receive 70/30 insulin while in the hospital for a short period of time.  3. Hypertension: The patient was continued on enalapril.  4. Hypernatremia: This resolved. We have monitored as we may have over-diuresed him, and this resolved. 5. Anemia: Hemoccult was negative. 6. Low albumin: We screened for malnutrition.  7. Liver cysts: We have recommended he get a MRI or repeat ultrasound in six months' time.  8. Deep vein thrombosis prophylaxis: He was maintained with aspirin and the patient was ambulatory. The patient was evaluated for home health and refused.  The patient was discharged on 02/27/2011. His temperature is 98.7, heart rate 78, respiratory rate 20, blood pressure 130/76, and saturating 99% on room air. Glucose is 106, BUN 18, creatinine 0.91, sodium 141, potassium 4.0, chloride 98, bicarbonate 33, and anion gap 10. Lungs are clear to auscultation. Heart has regular rate and rhythm. Abdomen is benign.  DISCHARGE MEDICATIONS: 1. Aspirin 81 mg daily.  2. Lasix 40 mg p.o. twice a day. 3. Enalapril 10 mg daily.  4. Metformin 500 mg p.o. twice a day. 5. K-Dur 20 milliequivalents  daily.  6. Coreg 3.125 mg p.o. twice a day.   DIET: Low-sodium, ADA diet.   OXYGEN: None.   ACTIVITY: As tolerated.   DISCHARGE FOLLOWUP: The patient is to see Dr. Darrick Huntsmanullo and Dr. Earlene PlaterWallace, as he wanted to change his primary care physician, in one week, and Dr. Gwen PoundsKowalski in two weeks. He should get a MRI of the liver in six months.   CODE STATUS: FULL CODE.   Thank you for allowing me to participate in the care of this patient.   TOTAL TIME SPENT ON DISCHARGE: 50 minutes.  ____________________________ Corie ChiquitoAmir A. Lafayette DragonFirozvi, MD aaf:slb D: 02/28/2011 10:59:51 ET T: 02/28/2011 14:37:20 ET JOB#: [id number]  cc: Karolee OhsAmir A. Lafayette DragonFirozvi, MD, <Dictator> PCP - [facility], [facility]  Duncan Dulleresa Tullo, MD Arnoldo HookerBruce Kowalski, MD Karolee OhsAMIR Laverda PageA Santasia Rew MD ELECTRONICALLY SIGNED 03/01/2011 14:57

## 2014-06-05 NOTE — Consult Note (Signed)
Present Illness 69 year old male with known diabetes mellitus, but no apparent hypertension, hyperlipidemia who has had some chronic lower extremity edema over the last 6 months.  This chronic lower extremity edema has been relatively well controlled.  The patient has been placed on Lasix for worsening ascites and abdominal edema.  He has been very short of breath over the last several weeks for the fact that he cannot walk even to the mailbox.  The patient has been accumulating this extra fluid over the last several weeks.  He just does have some mild amount of orthopnea and PND.  There is been no evidence of pulmonary hypertension or lung exposure in the recent past.  Patient has had an EKG without evidence of acute myocardial infarction.  Troponin and CK-MB are within normal limits.  His BNP is elevated.the patient did have a chest x-ray showing bilateral pleural effusions but no evidence of pulmonary edema  Family history No family members with early onset of cardiovascular disease  Social history Patient currently denies alcohol or tobacco use   Physical Exam:   GEN WD    HEENT pink conjunctivae    NECK No masses    RESP crackles    CARD Regular rate and rhythm    ABD denies tenderness  distended    LYMPH negative neck    EXTR positive edema    SKIN normal to palpation    NEURO cranial nerves intact    PSYCH alert   Review of Systems:   Subjective/Chief Complaint I am swollen and short of breath    Respiratory: Short of breath    Review of Systems: All other systems were reviewed and found to be negative    Medications/Allergies Reviewed Medications/Allergies reviewed     Diabetes:    Hypercholesterolemia:    htn:   Home Medications:  pravastatin:  orally , Active  Novolin 70/30 subcutaneous suspension: 40 unit(s) subcutaneous once a day, Active  metformin 500 mg oral tablet: 1 tab(s) orally 2 times a day, Active  enalapril:  orally , Active  Routine  Chem:  10-Jan-13 12:57    Glucose, Serum 114   BUN 9   Creatinine (comp) 1.04   Sodium, Serum 143   Potassium, Serum 3.8   Chloride, Serum 107   CO2, Serum 29   Calcium (Total), Serum 8.9  Hepatic:  10-Jan-13 12:57    Bilirubin, Total 0.7   Alkaline Phosphatase 142   SGPT (ALT) 35   SGOT (AST) 33   Total Protein, Serum 6.8   Albumin, Serum 3.4  Routine Chem:  10-Jan-13 12:57    Osmolality (calc) 285   eGFR (African American) >60   eGFR (Non-African American) >60   Anion Gap 7  Routine Hem:  10-Jan-13 12:57    WBC (CBC) 7.4   RBC (CBC) 4.19   Hemoglobin (CBC) 11.5   Hematocrit (CBC) 35.5   Platelet Count (CBC) 206   MCV 85   MCH 27.4   MCHC 32.3   RDW 16.2  Routine Chem:  10-Jan-13 12:57    B-Type Natriuretic Peptide [facility]) [id number]  Cardiac:  10-Jan-13 12:57    Troponin I < 0.02    No Known Allergies:     Impression 69 year old male with diabetes mellitus, hypertension, hyperlipidemia with chronic lower extremity edema, now with ascites, pleural effusion, hepatic congestion with shortness of breath consistent with hepatic abnormality versus acute congestive heart failure    Plan 1.  Intravenous Lasix for significant extremity edema and  ascites. 2.  Abdominal and cardiac ultrasound to assess for cardiomyopathy or other source of edema   3.  Continue diabetes mellitus, control without change. 4.  Further potential diagnostic testing including cardiac catheterization if necessary for assessment of pulmonary hypertension and/or congestive heart failure   Electronic Signatures: Corey Skains (MD)  (Signed 10-Jan-13 17:51)  Authored: General Aspect/Present Illness, History and Physical Exam, Review of System, Past Medical History, Home Medications, Labs, Allergies, Impression/Plan   Last Updated: 10-Jan-13 17:51 by Corey Skains (MD)

## 2014-09-07 IMAGING — US US EXTREM LOW VENOUS BILAT
1 series · 14 of 24 positions shown · non-contrast
Comparison: none

REASON FOR EXAM: decreased activity, leg pain
COMMENTS:

[Series 1: us extrem low venous bilat · 0.09mm/px · 38 acquisitions, 14 frames shown]
[im 1/38]
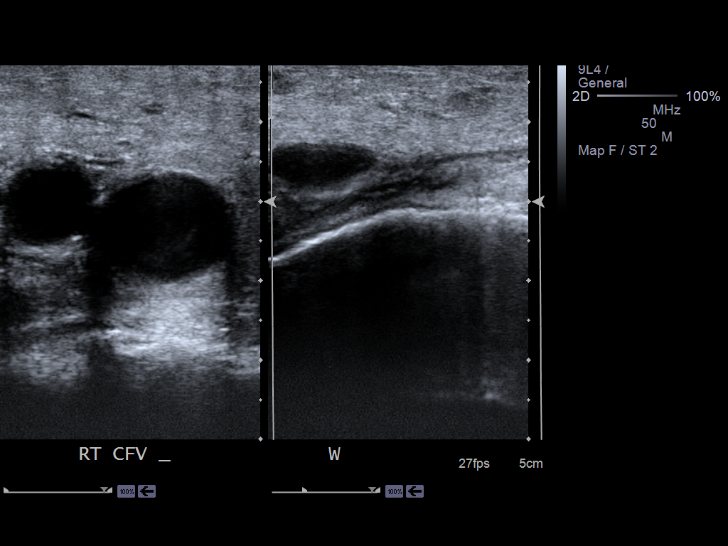
[im 4/38]
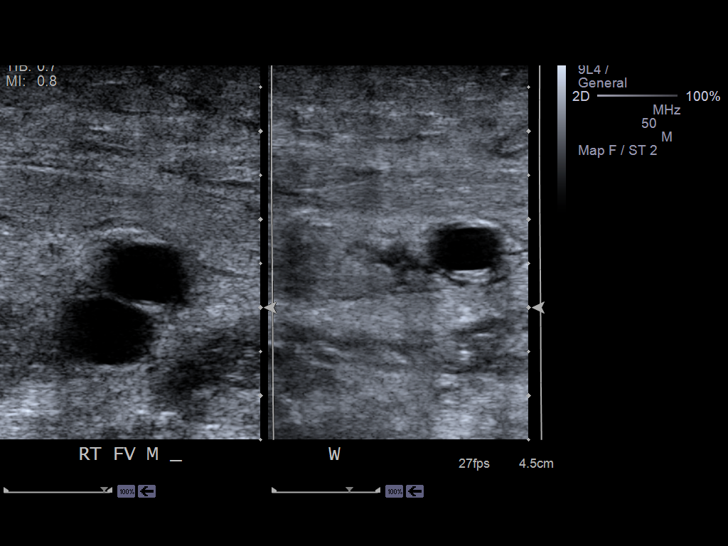
[im 9/38]
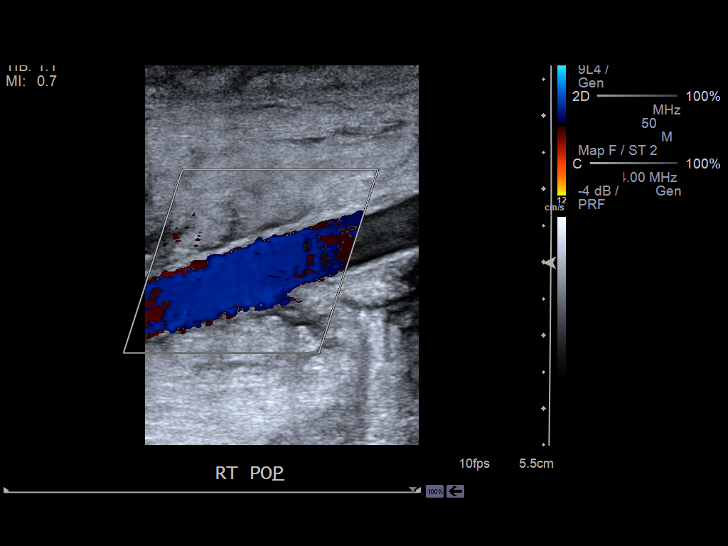
[im 12/38]
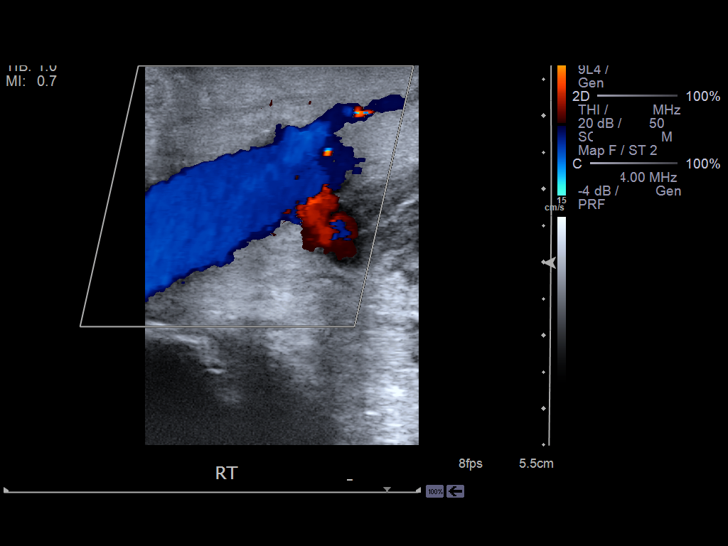
[im 13/38]
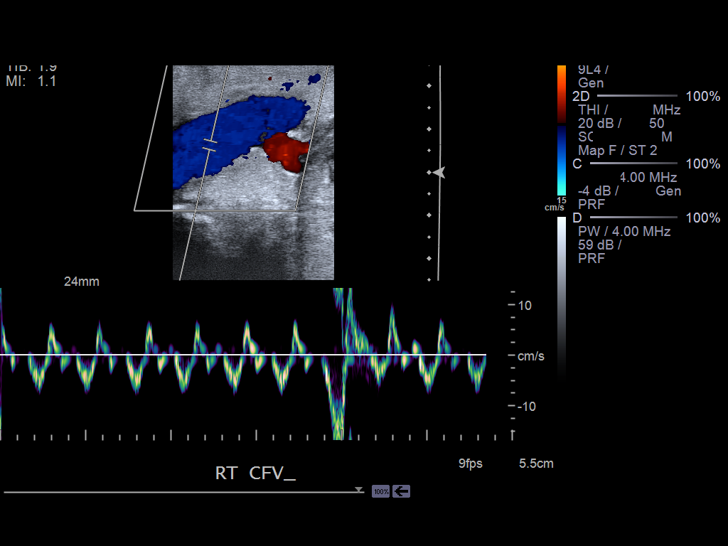
[im 17/38]
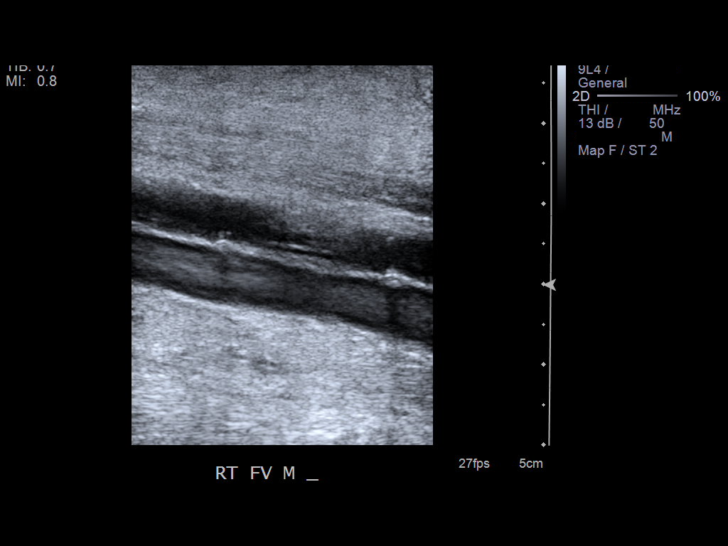
[im 18/38]
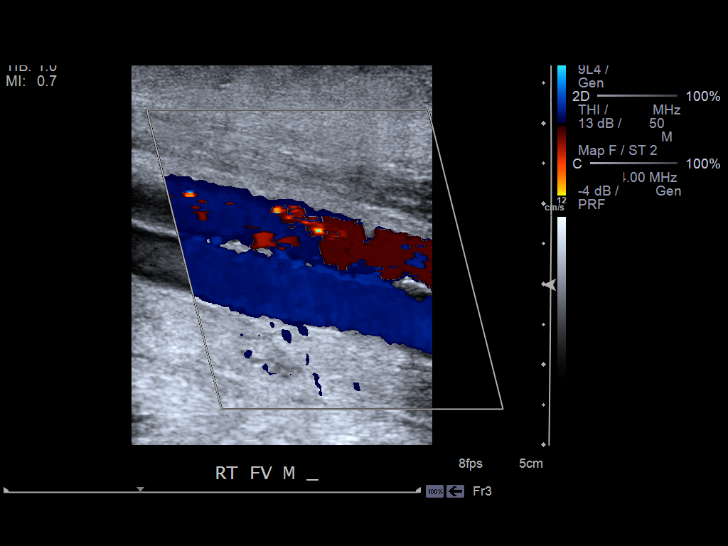
[im 21/38]
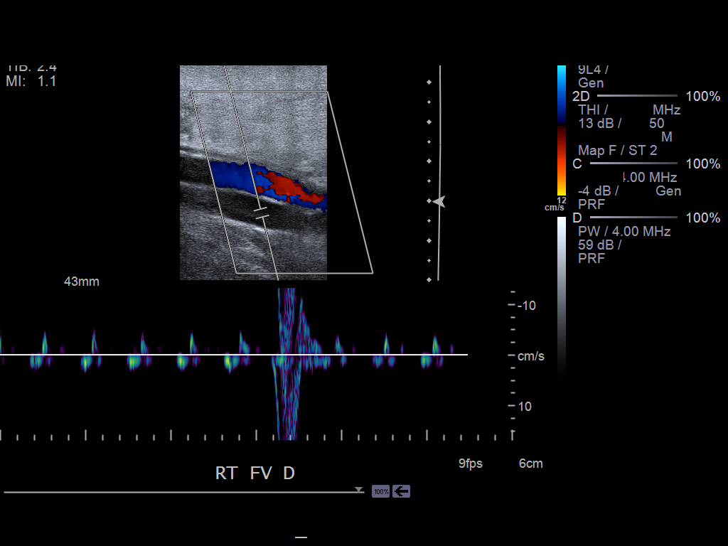
[im 25/38]
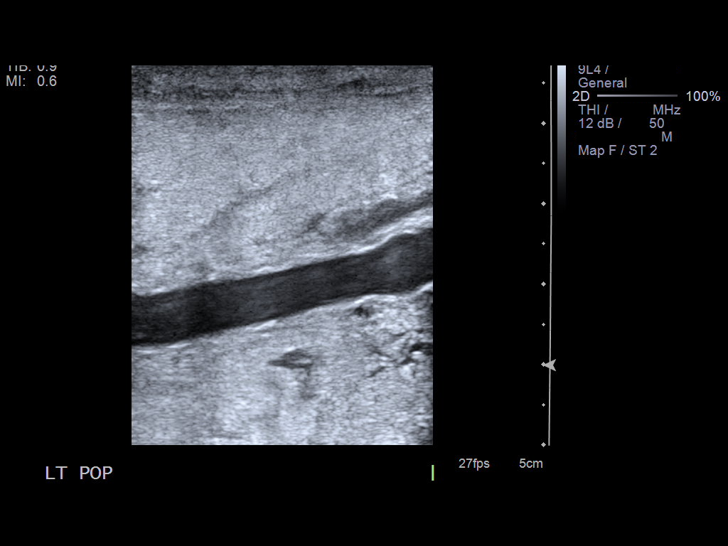
[im 26/38]
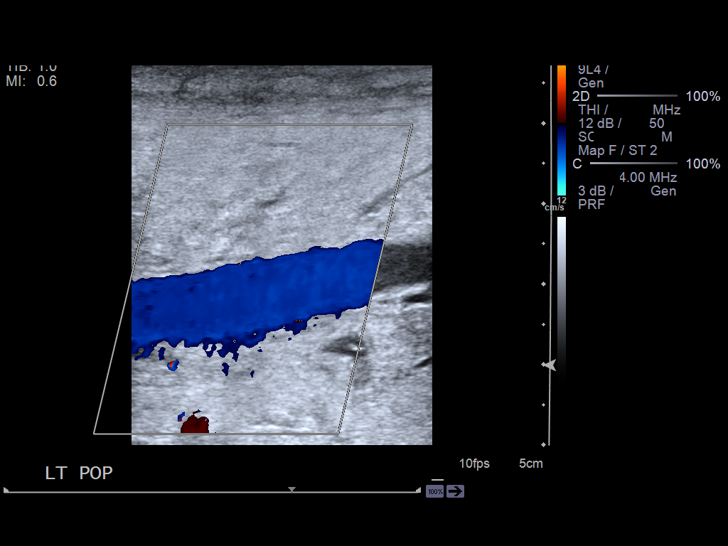
[im 29/38]
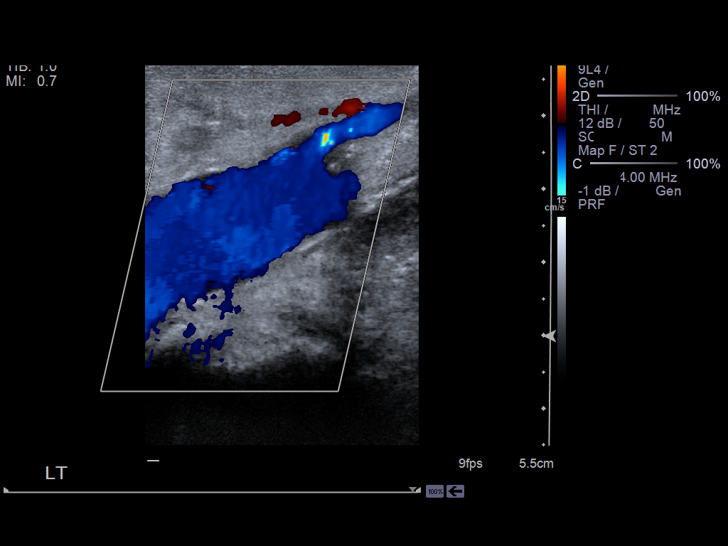
[im 31/38]
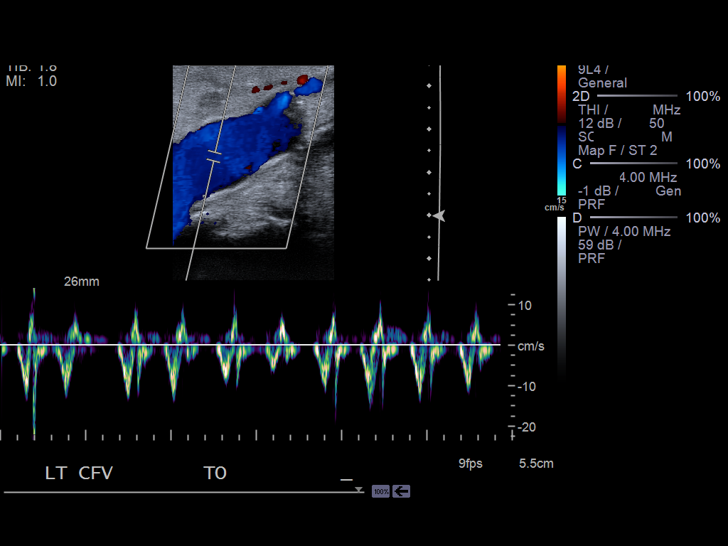
[im 34/38]
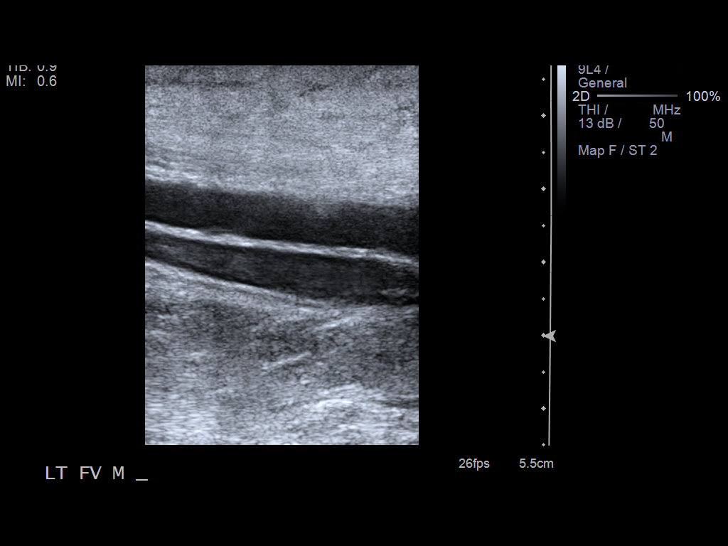
[im 38/38]
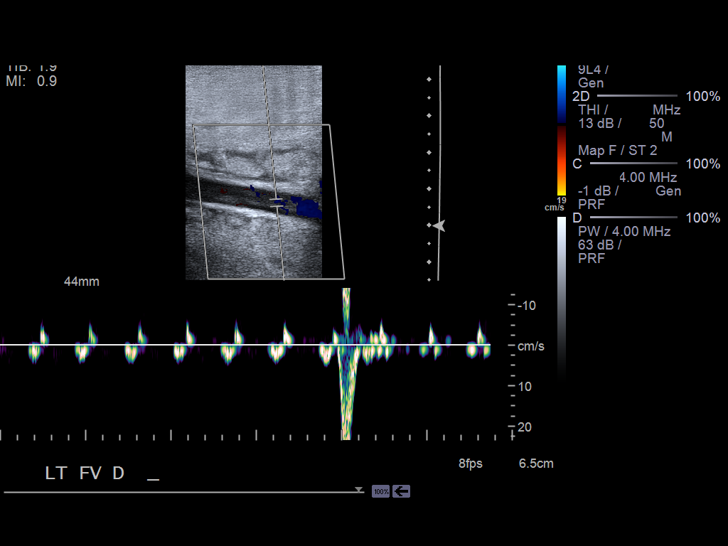

[14 of 24 positions shown; findings below may reference images not displayed]

PROCEDURE:     US  - US DOPPLER LOW EXTR BILATERAL  - April 05, 2012 [DATE]

RESULT:     Grayscale and color flow Doppler techniques were employed to
evaluate the deep veins of the right and left lower extremities.

The common femoral, superficial femoral, and popliteal veins are normally
compressible. The waveform patterns are normal and the color flow images are
normal. The patient does appear to be tachycardic. The response to the
augmentation and Valsalva maneuvers is normal.
IMPRESSION: There is no evidence of thrombus within the deep veins of
the right or left lower extremities.

A preliminary report was sent to the [HOSPITAL] the conclusion
of the study.

[REDACTED]

## 2014-09-08 IMAGING — CT CT CHEST-ABD-PELV W/O CM
1 of 2 series · 13 of 29 positions shown, 17 images · non-contrast
Comparison: none

REASON FOR EXAM: (1) weight loss; (2) Early satiety
COMMENTS:   May transport without cardiac monitor

[Series 2: soft tissue · axial · 0.98mm/px · z∈[-1340,-698]mm · 13 of 242 slices shown, 17 images]
[im 14/242  mediastinal]
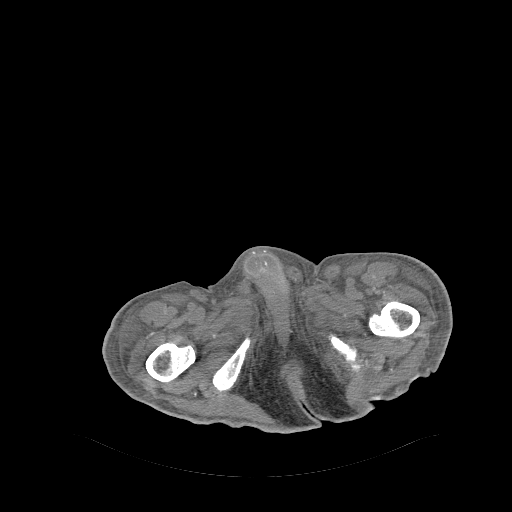
[im 14/242  bone]
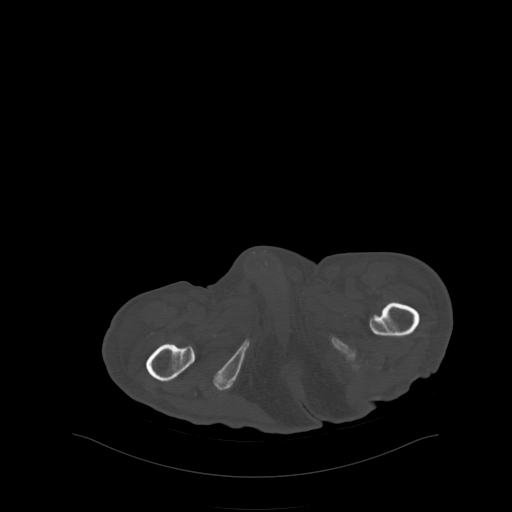
[im 41/242  mediastinal]
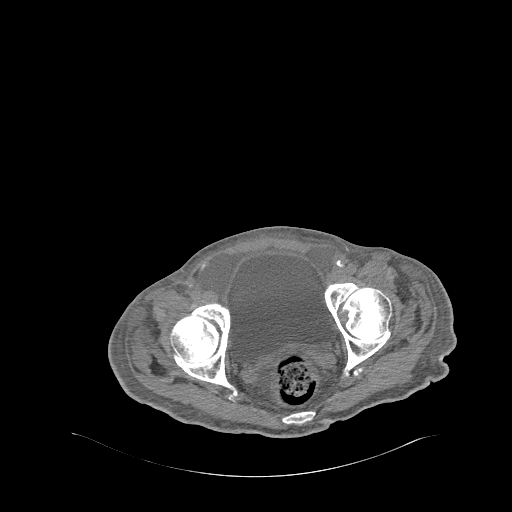
[im 67/242  mediastinal]
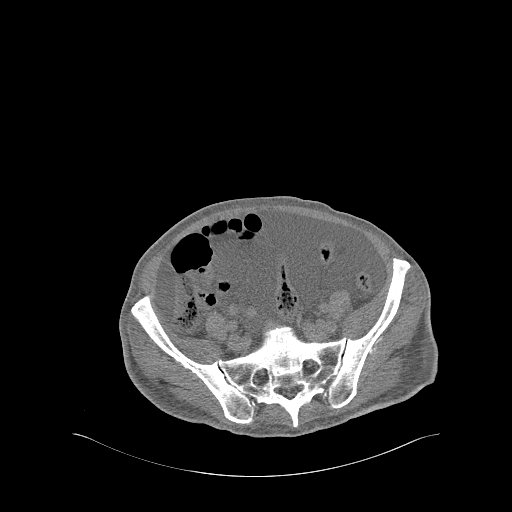
[im 81/242  mediastinal]
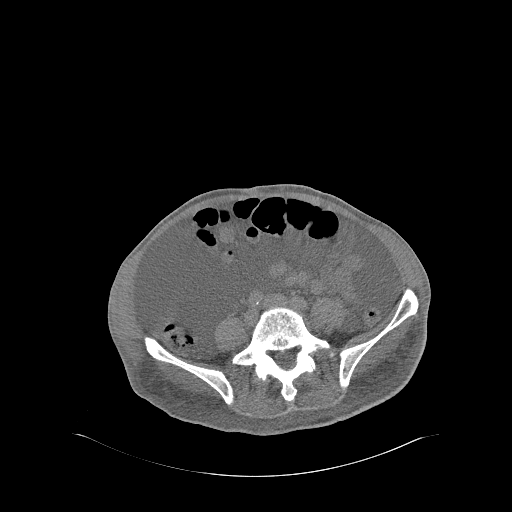
[im 108/242  mediastinal]
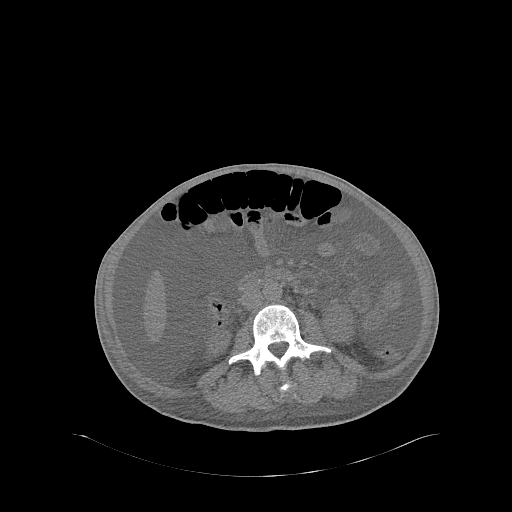
[im 121/242  mediastinal]
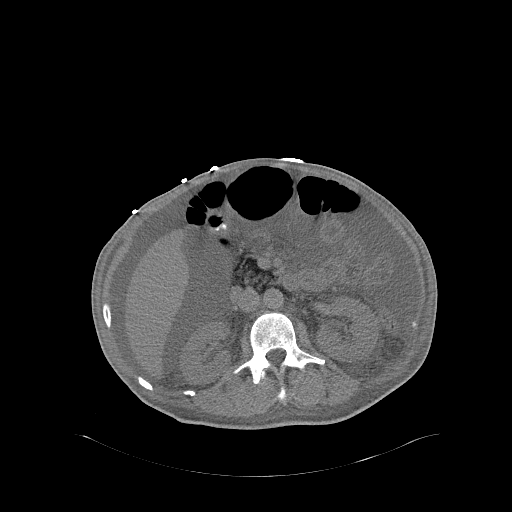
[im 134/242  mediastinal]
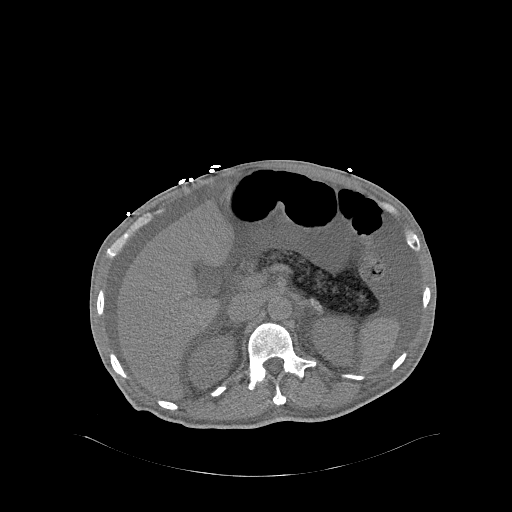
[im 161/242  mediastinal]
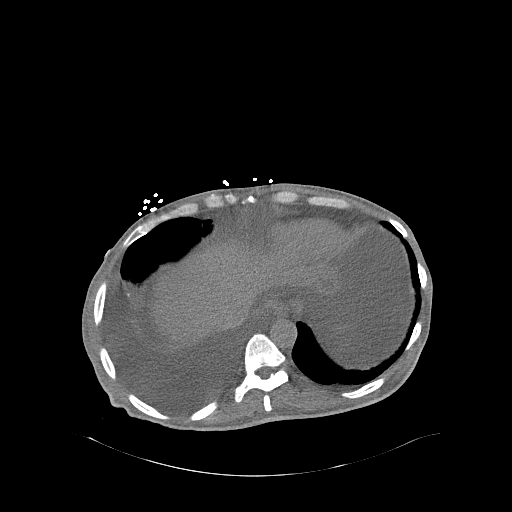
[im 175/242  mediastinal]
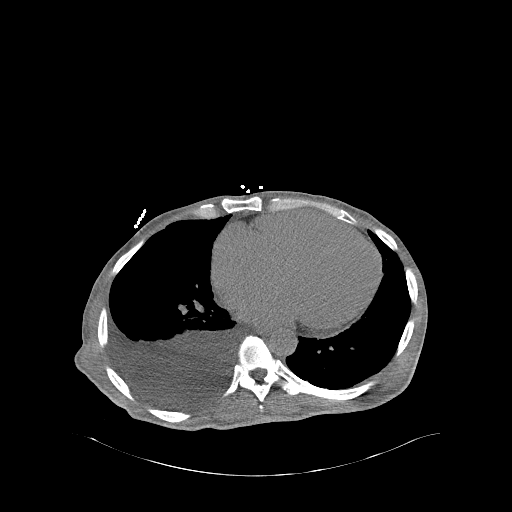
[im 175/242  bone]
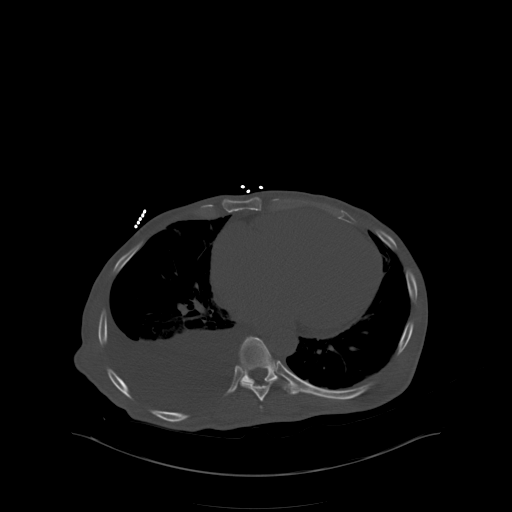
[im 188/242  lung]
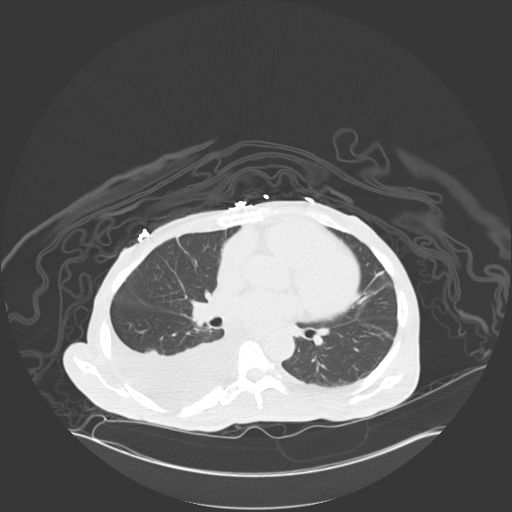
[im 201/242  mediastinal]
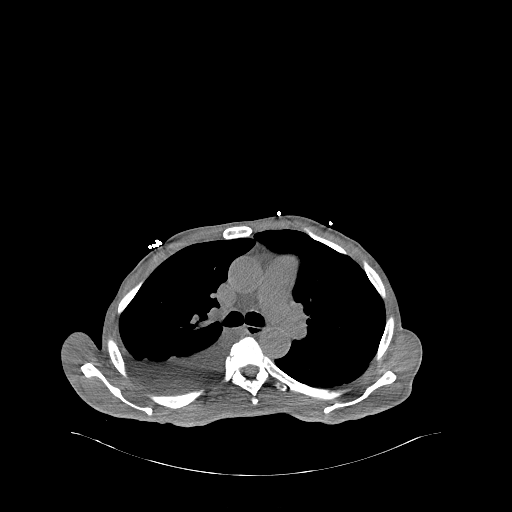
[im 201/242  lung]
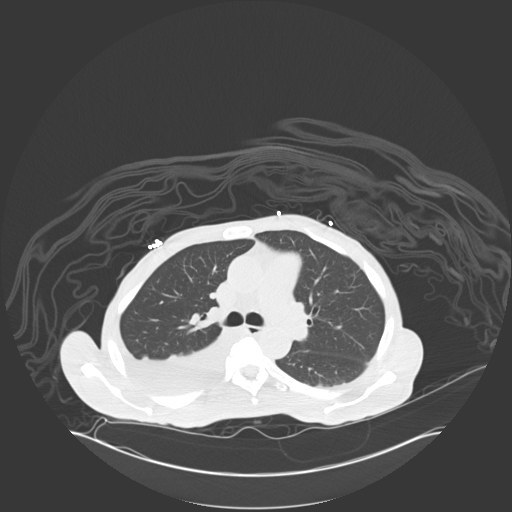
[im 215/242  lung]
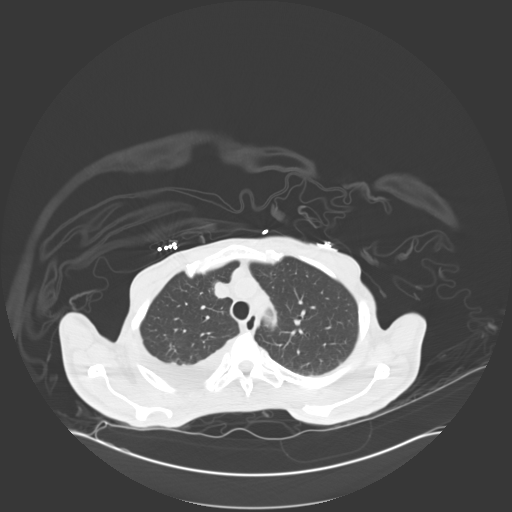
[im 228/242  mediastinal]
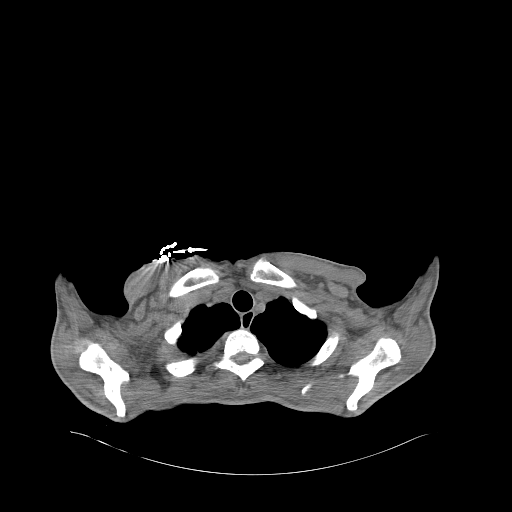
[im 228/242  lung]
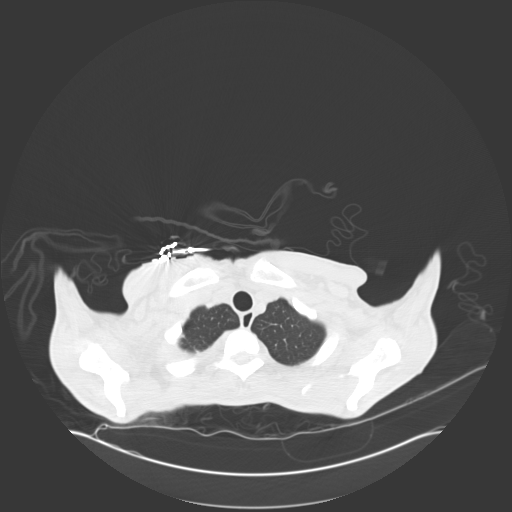

[13 of 29 positions shown; findings below may reference images not displayed]

PROCEDURE:     CT  - CT CHEST ABDOMEN AND PELVIS WO  - April 05, 2012  [DATE]

RESULT:     Axial noncontrast CT scanning was performed through the chest,
abdomen, and pelvis. The patient's serum creatinine is 1.9 mg/dL which
precluds use of IV contrast. Review of multiplanar reconstructed images was
performed separately on the VIA monitor.

CT scan of the chest: There is a large right pleural effusion. There is
consolidated lung adjacent to the anterior surface of this effusion in the
lower hemithorax. There is no left pleural effusion. The cardiac chambers
are enlarged. The caliber of the thoracic aorta is normal. No bulky
mediastinal or hilar lymph nodes are demonstrated. There is no axillary
lymphadenopathy. There is generalized wasting of subcutaneous fat.

At lung window settings there is minimal increased interstitial density in
the posterior costophrenic gutter on the left. There is the aforementioned
consolidated lung along the anterior surface of the right pleural effusion.
There are atelectatic versus fibrotic changes in the right middle lobe. No
suspicious pulmonary parenchymal masses are demonstrated. The thoracic
vertebral bodies are preserved in height. No lytic nor blastic rib lesion is
demonstrated.
CONCLUSION: 1. There is a large right pleural effusion. There is consolidated lung along
the anterior surface of the pleural effusion in the right lower hemithorax.
2. There is enlargement of the cardiac chambers without definite evidence of
a pericardial effusion.
3. No pulmonary parenchymal mass is demonstrated.

CT scan of the abdomen: There are four hypodensities within the liver. The
largest lies in the left lobe and measures 2.3 centimeters in greatest
dimension. It exhibits Hounsfield measurement of +2 and is most compatible
with a cyst. There is no intrahepatic ductal dilation. There is a large
amount of ascites within the abdomen. No discrete omental masses are
demonstrated but fine detail of the soft tissue structures within the
abdomen and pelvis is limited due to lack of oral and IV contrast and to the
paucity of intra-abdominal fat.

The stomach is partially distended with fluid and fluid and gas. The
pancreas exhibits fatty infiltration but no focal mass. The gallbladder is
adequately distended. I cannot exclude subcentimeter radiodense stones or
sludge in the dependent portion of the gallbladder. No adrenal masses are
demonstrated. There is no evidence of a small or large bowel obstruction.
Loops of normal calibered gas-filled small and large bowel are floating in
the ascites. The spleen is not enlarged. There is increased density within
the retroperitoneal fat fairly diffusely. The kidneys exhibit normal contour
and no evidence of calcified stones or obstruction. The caliber of the
abdominal aorta is normal. No bulky periaortic or pericaval or mesenteric
lymph nodes are demonstrated.

Within the inguinal regions no hernia is demonstrated. There are a few
borderline enlarged lymph nodes present in the inguinal regions. The lumbar
vertebral bodies are preserved in height. The bony pelvis exhibits no lytic
or blastic lesions.
IMPRESSION: 1. There is a large right pleural effusion and consolidated right lung
adjacent to this effusion. No discrete pulmonary mass is demonstrated but
certainly one could be obscured by the fluid and consolidated lung.
2. There is a large amount of ascites present. There are hypodensities in
the liver most compatible with cysts. The liver does not appear to be
shrunken and there is no splenomegaly. However, cirrhosis is not excluded.
3. There is no evidence of bowel obstruction or ileus. No suspicious bowel
wall thickening or luminal masses are demonstrated.
4. There is no acute urinary tract abnormality.
5. No pancreatic mass is demonstrated.

The sensitivity of the study is limited without oral and intravenous
contrast material.

[REDACTED]
